# Patient Record
Sex: Male | Born: 1970 | Race: White | Hispanic: No | Marital: Single | Smoking: Never smoker
Health system: Southern US, Community
[De-identification: ages and names within clinical notes are randomized; demographics above are authoritative.]

## PROBLEM LIST (undated history)

## (undated) DIAGNOSIS — I639 Cerebral infarction, unspecified: Secondary | ICD-10-CM

## (undated) DIAGNOSIS — Z789 Other specified health status: Secondary | ICD-10-CM

## (undated) HISTORY — PX: NO PAST SURGERIES: SHX2092

---

## 2007-07-13 ENCOUNTER — Emergency Department (HOSPITAL_COMMUNITY): Admission: EM | Admit: 2007-07-13 | Discharge: 2007-07-13 | Payer: Self-pay | Admitting: Emergency Medicine

## 2010-09-13 ENCOUNTER — Encounter (INDEPENDENT_AMBULATORY_CARE_PROVIDER_SITE_OTHER): Payer: Self-pay | Admitting: *Deleted

## 2010-09-13 ENCOUNTER — Ambulatory Visit: Admit: 2010-09-13 | Payer: Self-pay | Admitting: Internal Medicine

## 2010-09-28 ENCOUNTER — Encounter: Payer: Self-pay | Admitting: Family Medicine

## 2010-09-29 NOTE — Letter (Signed)
Summary: Unable to Reach, Consult Scheduled  [facility]  [address]   [place], [place] [address]   Phone: [phone]  Fax: [phone]    09/13/2010  MACLAIN Konicki [address] [place], [place]  [address] 1971/08/08   Dear Mr. Gulla,   We have been unable to reach you by phone.    At the recommendation of DR Phillips Odor  we have been asked to schedule you a consult with DR Jena Gauss for GERD.  Please call our office at [phone].     Thank you,    Diana Eves  [facility] R. Roetta Sessions, M.D.    Jonette Eva, M.D. Lorenza Burton, FNP-BC    Tana Coast, PA-C Phone: [phone]    Fax: [phone]

## 2011-11-21 ENCOUNTER — Encounter (HOSPITAL_COMMUNITY): Payer: Self-pay | Admitting: *Deleted

## 2011-11-21 ENCOUNTER — Emergency Department (HOSPITAL_COMMUNITY)
Admission: EM | Admit: 2011-11-21 | Discharge: 2011-11-21 | Disposition: A | Discharge status: Home / Self Care | Payer: Self-pay | Attending: Emergency Medicine | Admitting: Emergency Medicine

## 2011-11-21 ENCOUNTER — Other Ambulatory Visit: Payer: Self-pay

## 2011-11-21 ENCOUNTER — Emergency Department (HOSPITAL_COMMUNITY): Payer: Self-pay

## 2011-11-21 DIAGNOSIS — R509 Fever, unspecified: Secondary | ICD-10-CM | POA: Insufficient documentation

## 2011-11-21 DIAGNOSIS — R070 Pain in throat: Secondary | ICD-10-CM | POA: Insufficient documentation

## 2011-11-21 DIAGNOSIS — R61 Generalized hyperhidrosis: Secondary | ICD-10-CM | POA: Insufficient documentation

## 2011-11-21 DIAGNOSIS — R0789 Other chest pain: Secondary | ICD-10-CM

## 2011-11-21 DIAGNOSIS — R0602 Shortness of breath: Secondary | ICD-10-CM | POA: Insufficient documentation

## 2011-11-21 DIAGNOSIS — J40 Bronchitis, not specified as acute or chronic: Secondary | ICD-10-CM | POA: Insufficient documentation

## 2011-11-21 DIAGNOSIS — L98499 Non-pressure chronic ulcer of skin of other sites with unspecified severity: Secondary | ICD-10-CM | POA: Insufficient documentation

## 2011-11-21 DIAGNOSIS — R071 Chest pain on breathing: Secondary | ICD-10-CM | POA: Insufficient documentation

## 2011-11-21 DIAGNOSIS — R112 Nausea with vomiting, unspecified: Secondary | ICD-10-CM | POA: Insufficient documentation

## 2011-11-21 DIAGNOSIS — F411 Generalized anxiety disorder: Secondary | ICD-10-CM | POA: Insufficient documentation

## 2011-11-21 DIAGNOSIS — IMO0001 Reserved for inherently not codable concepts without codable children: Secondary | ICD-10-CM | POA: Insufficient documentation

## 2011-11-21 LAB — COMPREHENSIVE METABOLIC PANEL
ALT: 46 U/L (ref 0–53)
AST: 26 U/L (ref 0–37)
Alkaline Phosphatase: 67 U/L (ref 39–117)
CO2: 19 mEq/L (ref 19–32)
Chloride: 106 mEq/L (ref 96–112)
Creatinine, Ser: 0.82 mg/dL (ref 0.50–1.35)
GFR calc non Af Amer: >90 mL/min (ref >90)
Total Bilirubin: 0.2 mg/dL — ABNORMAL LOW (ref 0.3–1.2)

## 2011-11-21 LAB — CBC
HCT: 42.2 % (ref 39.0–52.0)
Hemoglobin: 14.2 g/dL (ref 13.0–17.0)
RDW: 13.5 % (ref 11.5–15.5)
WBC: 7.9 10*3/uL (ref 4.0–10.5)

## 2011-11-21 LAB — POCT I-STAT TROPONIN I: Troponin i, poc: 0 ng/mL (ref 0.00–0.08)

## 2011-11-21 LAB — DIFFERENTIAL
Basophils Absolute: 0 10*3/uL (ref 0.0–0.1)
Lymphocytes Relative: 15 % (ref 12–46)
Monocytes Absolute: 0.7 10*3/uL (ref 0.1–1.0)
Neutro Abs: 6 10*3/uL (ref 1.7–7.7)

## 2011-11-21 MED ORDER — KETOROLAC TROMETHAMINE 30 MG/ML IJ SOLN
30.0000 mg | Freq: Once | INTRAMUSCULAR | Status: AC
  Administered 2011-11-21: 30 mg via INTRAVENOUS
  Filled 2011-11-21: qty 1

## 2011-11-21 MED ORDER — FAMOTIDINE IN NACL 20-0.9 MG/50ML-% IV SOLN
20.0000 mg | Freq: Once | INTRAVENOUS | Status: AC
  Administered 2011-11-21: 20 mg via INTRAVENOUS
  Filled 2011-11-21: qty 50

## 2011-11-21 MED ORDER — SODIUM CHLORIDE 0.9 % IV BOLUS (SEPSIS)
1000.0000 mL | Freq: Once | INTRAVENOUS | Status: AC
  Administered 2011-11-21: 1000 mL via INTRAVENOUS

## 2011-11-21 MED ORDER — IBUPROFEN 600 MG PO TABS: 600.0000 mg | ORAL_TABLET | Freq: Four times a day (QID) | ORAL | Status: AC | PRN

## 2011-11-21 MED ORDER — CYCLOBENZAPRINE HCL 5 MG PO TABS: 5.0000 mg | ORAL_TABLET | Freq: Three times a day (TID) | ORAL | Status: AC | PRN

## 2011-11-21 MED ORDER — SODIUM CHLORIDE 0.9 % IV BOLUS (SEPSIS)
2000.0000 mL | Freq: Once | INTRAVENOUS | Status: AC
  Administered 2011-11-21: 2000 mL via INTRAVENOUS

## 2011-11-21 MED ORDER — ONDANSETRON HCL 4 MG/2ML IJ SOLN
4.0000 mg | Freq: Once | INTRAMUSCULAR | Status: AC
  Administered 2011-11-21: 4 mg via INTRAVENOUS
  Filled 2011-11-21: qty 2

## 2011-11-21 MED ORDER — METOCLOPRAMIDE HCL 5 MG/ML IJ SOLN
10.0000 mg | Freq: Once | INTRAMUSCULAR | Status: AC
  Administered 2011-11-21: 10 mg via INTRAVENOUS
  Filled 2011-11-21: qty 2

## 2011-11-21 MED ORDER — GI COCKTAIL ~~LOC~~
30.0000 mL | Freq: Once | ORAL | Status: AC
  Administered 2011-11-21: 30 mL via ORAL
  Filled 2011-11-21: qty 30

## 2011-11-21 MED ORDER — SODIUM CHLORIDE 0.9 % IV SOLN
Freq: Once | INTRAVENOUS | Status: AC
  Administered 2011-11-21: 13:00:00 via INTRAVENOUS

## 2011-11-21 MED ORDER — PROMETHAZINE HCL 25 MG PO TABS: 25.0000 mg | ORAL_TABLET | Freq: Four times a day (QID) | ORAL | Status: AC | PRN

## 2011-11-21 MED ORDER — AMOXICILLIN 500 MG PO CAPS: 500.0000 mg | ORAL_CAPSULE | Freq: Three times a day (TID) | ORAL | Status: AC

## 2011-11-21 MED ORDER — DIPHENHYDRAMINE HCL 50 MG/ML IJ SOLN
50.0000 mg | Freq: Once | INTRAMUSCULAR | Status: AC
  Administered 2011-11-21: 50 mg via INTRAVENOUS
  Filled 2011-11-21: qty 1

## 2011-11-21 NOTE — ED Provider Notes (Signed)
History   This chart was scribed for Ward Givens, MD by Brooks Sailors. The patient was seen in room APA14/APA14. Patient's care was started at 1252.   CSN: [id number]  Arrival date & time 11/21/11  1252   First MD Initiated Contact with Patient 11/21/11 1258      Chief Complaint  Patient presents with  . Chest Pain    (Consider location/radiation/quality/duration/timing/severity/associated sxs/prior treatment) HPI Christian Russell is a 41 y.o. male who presents to the Emergency Department complaining of constant moderate to severe chest pain described as sharp onset this morning upon awaking starting about 8:30 am today  with associated SOB and episode of n/v. Pain is located in the middle of the chest and severely aggravated by deep breathing and coughing. Patient also notes experiencing flu like symptoms started three days ago and are persistent since consisting of fever of 101, myalgias, mild sore throat, night sweats, weakness, nausea, and vomiting. Denies diaphoresis, diarrhea and a history of similar chest pain. Patient denies smoking and does drink alcohol.   PCP none  History reviewed. No pertinent past medical history.  History reviewed. No pertinent past surgical history.  No family history on file. ? FOP had MI  History  Substance Use Topics  . Smoking status: No  . Smokeless tobacco: Not on file  . 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    Review of Systems  All other systems reviewed and are negative.   10 Systems reviewed and are negative for acute change except as noted in the HPI.  Allergies  Review of patient's allergies indicates no known allergies.  Home Medications   Current Outpatient Rx  Name Route Sig Dispense Refill  . DM-GUAIFENESIN ER 30-600 MG PO TB12 Oral Take 1 tablet by mouth every 12 (twelve) hours. Congestion/Just started taking these today    .  PSEUDOEPHEDRINE-APAP-DM 60-1000-30 MG/30ML PO LIQD Oral Take 30 mLs by mouth 3 (three) times daily as needed. Congestion/cough    . AMOXICILLIN 500 MG PO CAPS Oral Take 1 capsule (500 mg total) by mouth 3 (three) times daily. 30 capsule 0  . CYCLOBENZAPRINE HCL 5 MG PO TABS Oral Take 1 tablet (5 mg total) by mouth 3 (three) times daily as needed (for chest soreness). 30 tablet 0  . IBUPROFEN 600 MG PO TABS Oral Take 1 tablet (600 mg total) by mouth every 6 (six) hours as needed for pain. 60 tablet 0  . PROMETHAZINE HCL 25 MG PO TABS Oral Take 1 tablet (25 mg total) by mouth every 6 (six) hours as needed for nausea. 10 tablet 0    BP 132/98  Pulse 99  Ht 5\' 6"  (1.676 m)  Wt 150 lb (68.04 kg)  BMI 24.21 kg/m2  SpO2 100%  Vital signs normal    Physical Exam  Nursing note and vitals reviewed. Constitutional: He is oriented to person, place, and time. He appears well-developed and well-nourished.  Non-toxic appearance. He does not appear ill. No distress.  HENT:  Head: Normocephalic and atraumatic.  Right Ear: External ear normal.  Left Ear: External ear normal.  Nose: Nose normal. No mucosal edema or rhinorrhea.  Mouth/Throat: Oropharynx is clear and moist and mucous membranes are normal. No dental abscesses or uvula swelling.       Dry mouth and tongue  Eyes: Conjunctivae and EOM are normal. Pupils are equal, round, and reactive to light.  Neck: Normal  range of motion and full passive range of motion without pain. Neck supple. No tracheal deviation present.  Cardiovascular: Normal rate, regular rhythm and normal heart sounds.  Exam reveals no gallop and no friction rub.   No murmur heard. Pulmonary/Chest: Effort normal and breath sounds normal. No respiratory distress. He has no wheezes. He has no rhonchi. He has no rales. He exhibits no tenderness and no crepitus.       Tender lower left CCJ  Abdominal: Soft. Normal appearance and bowel sounds are normal. He exhibits no distension.  There is no tenderness. There is no rebound and no guarding.       Active bowel sounds   Musculoskeletal: Normal range of motion. He exhibits no edema and no tenderness.       Moves all extremities well.   Neurological: He is alert and oriented to person, place, and time. He has normal strength. No cranial nerve deficit or sensory deficit.  Skin: Skin is warm, dry and intact. No rash noted. No erythema. No pallor.  Psychiatric: His speech is normal and behavior is normal. His mood appears not anxious.       anxious    ED Course  Procedures (including critical care time)   Medications  0.9 %  sodium chloride infusion (  Intravenous New Bag/Given 11/21/11 1317)  sodium chloride 0.9 % bolus 2,000 mL (2000 mL Intravenous Given 11/21/11 1316)  metoCLOPramide (REGLAN) injection 10 mg (10 mg Intravenous Given 11/21/11 1317)  diphenhydrAMINE (BENADRYL) injection 50 mg (50 mg Intravenous Given 11/21/11 1317)  ondansetron (ZOFRAN) injection 4 mg (4 mg Intravenous Given 11/21/11 1317)  famotidine (PEPCID) IVPB 20 mg (0 mg Intravenous Stopped 11/21/11 1528)  ketorolac (TORADOL) 30 MG/ML injection 30 mg (30 mg Intravenous Given 11/21/11 1534)  sodium chloride 0.9 % bolus 1,000 mL (1000 mL Intravenous Given 11/21/11 1534)  gi cocktail (Maalox,Lidocaine,Donnatal) (30 mL Oral Given 11/21/11 1536)     DIAGNOSTIC STUDIES: Oxygen Saturation is 100% on room air, normal by my interpretation.    COORDINATION OF CARE: 1:05PM- Patient informed of current plan for treatment and evaluation and agrees with plan at this time.   Recheck 15:30 states his pain was better but is now coming back.   Recheck 16:30 feeling better after toradol. Eating crackers feels ready to go home. States he had some shoes in his bedroom that had racing car fuel on them and he thinks maybe the fumes made him ill. He has put the shoes outside.   Results for orders placed during the hospital encounter of 11/21/11  CBC      Component Value  Range   WBC 7.9  4.0 - 10.5 (K/uL)   RBC 3.91 (*) 4.22 - 5.81 (MIL/uL)   Hemoglobin 14.2  13.0 - 17.0 (g/dL)   HCT 16.1  09.6 - 04.5 (%)   MCV 107.9 (*) 78.0 - 100.0 (fL)   MCH 36.3 (*) 26.0 - 34.0 (pg)   MCHC 33.6  30.0 - 36.0 (g/dL)   RDW 40.9  81.1 - 91.4 (%)   Platelets 244  150 - 400 (K/uL)  DIFFERENTIAL      Component Value Range   Neutrophils Relative 76  43 - 77 (%)   Neutro Abs 6.0  1.7 - 7.7 (K/uL)   Lymphocytes Relative 15  12 - 46 (%)   Lymphs Abs 1.2  0.7 - 4.0 (K/uL)   Monocytes Relative 8  3 - 12 (%)   Monocytes Absolute 0.7  0.1 - 1.0 (  K/uL)   Eosinophils Relative 0  0 - 5 (%)   Eosinophils Absolute 0.0  0.0 - 0.7 (K/uL)   Basophils Relative 0  0 - 1 (%)   Basophils Absolute 0.0  0.0 - 0.1 (K/uL)  COMPREHENSIVE METABOLIC PANEL      Component Value Range   Sodium 136  135 - 145 (mEq/L)   Potassium 3.5  3.5 - 5.1 (mEq/L)   Chloride 106  96 - 112 (mEq/L)   CO2 19  19 - 32 (mEq/L)   Glucose, Bld 113 (*) 70 - 99 (mg/dL)   BUN 7  6 - 23 (mg/dL)   Creatinine, Ser 2.13  0.50 - 1.35 (mg/dL)   Calcium 9.3  8.4 - 08.6 (mg/dL)   Total Protein 7.5  6.0 - 8.3 (g/dL)   Albumin 4.3  3.5 - 5.2 (g/dL)   AST 26  0 - 37 (U/L)   ALT 46  0 - 53 (U/L)   Alkaline Phosphatase 67  39 - 117 (U/L)   Total Bilirubin 0.2 (*) 0.3 - 1.2 (mg/dL)   GFR calc non Af Amer >90  >90 (mL/min)   GFR calc Af Amer >90  >90 (mL/min)  D-DIMER, QUANTITATIVE      Component Value Range   D-Dimer, Quant <0.22  0.00 - 0.48 (ug/mL-FEU)  POCT I-STAT TROPONIN I      Component Value Range   Troponin i, poc 0.00  0.00 - 0.08 (ng/mL)   Comment 3           POCT I-STAT TROPONIN I      Component Value Range   Troponin i, poc 0.00  0.00 - 0.08 (ng/mL)   Comment 3            Laboratory interpretation all normal  Dg Chest Port 1 View  11/21/2011  *RADIOLOGY REPORT*  Clinical Data: Chest pain, shortness of breath.  PORTABLE CHEST - 1 VIEW  Comparison: None.  Findings: Heart and mediastinal contours are  within normal limits. No focal opacities or effusions.  No acute bony abnormality.  IMPRESSION: No active cardiopulmonary disease.  Original Report Authenticated By: Cyndie Chime, M.D.    Date: 11/21/2011  Rate: 105  Rhythm: sinus tachycardia  QRS Axis: normal  Intervals: normal  ST/T Wave abnormalities: nonspecific ST/T changes  Conduction Disutrbances:none  Narrative Interpretation:   Old EKG Reviewed: none available      1. Bronchitis   2. Chest wall pain   3. Nausea and vomiting    New Prescriptions   AMOXICILLIN (AMOXIL) 500 MG CAPSULE    Take 1 capsule (500 mg total) by mouth 3 (three) times daily.   CYCLOBENZAPRINE (FLEXERIL) 5 MG TABLET    Take 1 tablet (5 mg total) by mouth 3 (three) times daily as needed (for chest soreness).   IBUPROFEN (ADVIL,MOTRIN) 600 MG TABLET    Take 1 tablet (600 mg total) by mouth every 6 (six) hours as needed for pain.   PROMETHAZINE (PHENERGAN) 25 MG TABLET    Take 1 tablet (25 mg total) by mouth every 6 (six) hours as needed for nausea.   Plan discharge Devoria Albe, MD, FACEP    MDM   I personally performed the services described in this documentation, which was scribed in my presence. The recorded information has been reviewed and considered. Devoria Albe, MD, FACEPWard Givens, MD 11/21/11 [phone]

## 2011-11-21 NOTE — ED Notes (Signed)
Pt presents with chest pain x 1 day, which was relieved last night with OTC cold/flu medication, however the pain return this morning. Denies arm pain, N/V

## 2011-11-21 NOTE — Discharge Instructions (Signed)
Drink plenty of fluids (clear liquids) the next 12-24 hours then start the BRAT diet. . Use the phenergan for nausea or vomiting. Take the amoxil until gone for your bronchitis. Take the ibuprofen and flexeril for your chest wall pain.  Recheck if you get worse.  Clear Liquid Diet The clear liquid dietconsists of foods that are liquid or will become liquid at room temperature.You should be able to see through the liquid and beverages. Examples of foods allowed on a clear liquid diet include fruit juice, broth or bouillon, gelatin, or frozen ice pops. The purpose of this diet is to provide necessary fluid, electrolytes such as sodium and potassium, and energy to keep the body functioning during times when you are not able to consume a regular diet.A clear liquid diet should not be continued for long periods of time as it is not nutritionally adequate.  REASONS FOR USING A CLEAR LIQUID DIET  In sudden onset (acute) conditions for a patient before or after surgery.   As the first step in oral feeding.   For fluid and electrolyte replacement in diarrheal diseases.   As a diet before certain medical tests are performed.  ADEQUACY The clear liquid diet is adequate only in ascorbic acid, according to the Recommended Dietary Allowances of the [employer]. CHOOSING FOODS Breads and Starches  Allowed:  None are allowed.   Avoid: All are avoided.  Vegetables  Allowed:  Strained tomato or vegetable juice.   Avoid: Any others.  Fruit  Allowed:  Strained fruit juices and fruit drinks. Include 1 serving of citrus or vitamin C-enriched fruit juice daily.   Avoid: Any others.  Meat and Meat Substitutes  Allowed:  None are allowed.   Avoid: All are avoided.  Milk  Allowed:  None are allowed.   Avoid: All are avoided.  Soups and Combination Foods  Allowed:  Clear bouillon, broth, or strained broth-based soups.   Avoid: Any others.  Desserts and Sweets  Allowed:  Sugar,  honey. High protein gelatin. Flavored gelatin, ices, or frozen ice pops that do not contain milk.   Avoid: Any others.  Fats and Oils  Allowed:  None are allowed.   Avoid: All are avoided.  Beverages  Allowed: Cereal beverages, coffee (regular or decaffeinated), tea, or soda at the discretion of your caregiver.   Avoid: Any others.  Condiments  Allowed:  Iodized salt.   Avoid: Any others, including pepper.  Supplements  Allowed:  Liquid nutrition beverages.   Avoid: Any others that contain lactose or fiber.  SAMPLE MEAL PLAN Breakfast  4 oz (120 mL) strained orange juice.    to 1 cup (125 to 250 mL) gelatin (plain or fortified).   1 cup (250 mL) beverage (coffee or tea).   Sugar, if desired.  Midmorning Snack   cup (125 mL) gelatin (plain or fortified).  Lunch  1 cup (250 mL) broth or consomm.   4 oz (120 mL) strained grapefruit juice.    cup (125 mL) gelatin (plain or fortified).   1 cup (250 mL) beverage (coffee or tea).   Sugar, if desired.  Midafternoon Snack   cup (125 mL) fruit ice.    cup (125 mL) strained fruit juice.  Dinner  1 cup (250 mL) broth or consomm.    cup (125 mL) cranberry juice.    cup (125 mL) flavored gelatin (plain or fortified).   1 cup (250 mL) beverage (coffee or tea).   Sugar, if desired.  Evening Snack  4 oz (120 mL) strained apple juice (vitamin C-fortified).    cup (125 mL) flavored gelatin (plain or fortified).  Document Released: 08/14/2005 Document Revised: 08/03/2011 Document Reviewed: 11/11/2010 [facility] Patient Information 2012 [place], [place].B.R.A.T. Diet Your doctor has recommended the B.R.A.T. diet for you or your child until the condition improves. This is often used to help control diarrhea and vomiting symptoms. If you or your child can tolerate clear liquids, you may have:  Bananas.   Rice.   Applesauce.   Toast (and other simple starches such as crackers, potatoes, noodles).  Be sure  to avoid dairy products, meats, and fatty foods until symptoms are better. Fruit juices such as apple, grape, and prune juice can make diarrhea worse. Avoid these. Continue this diet for 2 days or as instructed by your caregiver. Document Released: 08/14/2005 Document Revised: 08/03/2011 Document Reviewed: 01/31/2007 [facility] Patient Information 2012 [place], [place].

## 2014-10-16 ENCOUNTER — Encounter: Payer: Self-pay | Admitting: *Deleted

## 2014-11-11 ENCOUNTER — Encounter: Payer: Self-pay | Admitting: Family Medicine

## 2014-11-11 ENCOUNTER — Ambulatory Visit (INDEPENDENT_AMBULATORY_CARE_PROVIDER_SITE_OTHER): Payer: BLUE CROSS/BLUE SHIELD | Admitting: Family Medicine

## 2014-11-11 VITALS — BP 110/72 | HR 78 | Temp 98.7°F | Resp 12 | Ht 64.0 in | Wt 157.0 lb

## 2014-11-11 DIAGNOSIS — Z23 Encounter for immunization: Secondary | ICD-10-CM

## 2014-11-11 DIAGNOSIS — Z0000 Encounter for general adult medical examination without abnormal findings: Secondary | ICD-10-CM

## 2014-11-11 LAB — COMPREHENSIVE METABOLIC PANEL
ALT: 23 U/L (ref 0–53)
AST: 19 U/L (ref 0–37)
Albumin: 4.6 g/dL (ref 3.5–5.2)
Alkaline Phosphatase: 59 U/L (ref 39–117)
BUN: 14 mg/dL (ref 6–23)
CALCIUM: 9.4 mg/dL (ref 8.4–10.5)
CHLORIDE: 104 meq/L (ref 96–112)
CO2: 27 meq/L (ref 19–32)
Creat: 0.9 mg/dL (ref 0.50–1.35)
Glucose, Bld: 109 mg/dL — ABNORMAL HIGH (ref 70–99)
Potassium: 4.7 mEq/L (ref 3.5–5.3)
Sodium: 142 mEq/L (ref 135–145)
TOTAL PROTEIN: 7 g/dL (ref 6.0–8.3)
Total Bilirubin: 0.4 mg/dL (ref 0.2–1.2)

## 2014-11-11 LAB — CBC WITH DIFFERENTIAL/PLATELET
Basophils Absolute: 0.1 10*3/uL (ref 0.0–0.1)
Basophils Relative: 1 % (ref 0–1)
EOS ABS: 0.1 10*3/uL (ref 0.0–0.7)
Eosinophils Relative: 1 % (ref 0–5)
HCT: 42.6 % (ref 39.0–52.0)
Hemoglobin: 14.7 g/dL (ref 13.0–17.0)
Lymphocytes Relative: 28 % (ref 12–46)
Lymphs Abs: 2.5 10*3/uL (ref 0.7–4.0)
MCH: 33.6 pg (ref 26.0–34.0)
MCHC: 34.5 g/dL (ref 30.0–36.0)
MCV: 97.5 fL (ref 78.0–100.0)
MPV: 9.3 fL (ref 8.6–12.4)
Monocytes Absolute: 0.6 10*3/uL (ref 0.1–1.0)
Monocytes Relative: 7 % (ref 3–12)
NEUTROS ABS: 5.6 10*3/uL (ref 1.7–7.7)
NEUTROS PCT: 63 % (ref 43–77)
PLATELETS: 332 10*3/uL (ref 150–400)
RBC: 4.37 MIL/uL (ref 4.22–5.81)
RDW: 13.3 % (ref 11.5–15.5)
WBC: 8.9 10*3/uL (ref 4.0–10.5)

## 2014-11-11 LAB — LIPID PANEL
CHOL/HDL RATIO: 4.3 ratio
CHOLESTEROL: 182 mg/dL (ref 0–200)
HDL: 42 mg/dL (ref >=40)
LDL Cholesterol: 90 mg/dL (ref 0–99)
Triglycerides: 249 mg/dL — ABNORMAL HIGH (ref <150)
VLDL: 50 mg/dL — AB (ref 0–40)

## 2014-11-11 NOTE — Patient Instructions (Signed)
I recommend eye visit once a year I recommend dental visit every 6 months Goal is to  Exercise 30 minutes 5 days a week We will send a letter with lab results  Tetanus Booster given F/U as needed

## 2014-11-11 NOTE — Addendum Note (Signed)
Addended by: Legrand RamsWILLIS, Siyona Coto B on: 11/11/2014 10:40 AM   Modules accepted: Orders

## 2014-11-11 NOTE — Progress Notes (Signed)
Patient ID: Christian Russell [place]ripps, male   DOB: November 17, 1970, 44 y.o.   MRN: [id number]   Subjective:    Patient ID: Christian Russell [place], male    DOB: November 17, 1970, 44 y.o.   MRN: [id number]  Patient presents for New Patient and Annual Exam  Pt here for CPE, no specific concerns. No previous PCP, recently got insurance.   Had some mild bleeding from belly button a few weeks ago, occurred 1 day, now resolved.  Family history reviewed- notable Father- DM, HTN, heart disease   Non smoker Works 3rd shift for [employer]    Review Of Systems:  GEN- denies fatigue, fever, weight loss,weakness, recent illness HEENT- denies eye drainage, change in vision, nasal discharge, CVS- denies chest pain, palpitations RESP- denies SOB, cough, wheeze ABD- denies N/V, change in stools, abd pain GU- denies dysuria, hematuria, dribbling, incontinence MSK- denies joint pain, muscle aches, injury Neuro- denies headache, dizziness, syncope, seizure activity       Objective:    BP 110/72 mmHg  Pulse 78  Temp(Src) 98.7 F (37.1 C) (Oral)  Resp 12  Ht 5\' 6"  (1.676 m)  Wt 157 lb (71.215 kg)  BMI 25.35 kg/m2 GEN- NAD, alert and oriented x3 HEENT- PERRL, EOMI, non injected sclera, pink conjunctiva, MMM, oropharynx clear, poor dentition Neck- Supple, no thyromegaly CVS- RRR, no murmur RESP-CTAB ABD-NABS,soft,NT,ND, umbilicus c/d/i Psych- normal affect and mood , quiet EXT- No edema Pulses- Radial, DP- 2+        Assessment & Plan:      Problem List Items Addressed This Visit    None    Visit Diagnoses    Routine general medical examination at a health care facility    -  Primary    CPE done, TDAP, fasting labs, dental information given    Relevant Orders    CBC with Differential/Platelet    Comprehensive metabolic panel    Lipid panel       Note: This dictation was prepared with Dragon dictation along with smaller phrase technology. Any transcriptional errors that result from this process are unintentional.

## 2015-10-28 ENCOUNTER — Encounter: Payer: Self-pay | Admitting: Family Medicine

## 2015-10-28 ENCOUNTER — Ambulatory Visit (INDEPENDENT_AMBULATORY_CARE_PROVIDER_SITE_OTHER): Payer: BLUE CROSS/BLUE SHIELD | Admitting: Physician Assistant

## 2015-10-28 ENCOUNTER — Encounter: Payer: Self-pay | Admitting: Physician Assistant

## 2015-10-28 VITALS — BP 152/104 | HR 100 | Temp 98.7°F | Resp 18 | Wt 160.0 lb

## 2015-10-28 DIAGNOSIS — J101 Influenza due to other identified influenza virus with other respiratory manifestations: Secondary | ICD-10-CM

## 2015-10-28 DIAGNOSIS — R52 Pain, unspecified: Secondary | ICD-10-CM | POA: Diagnosis not present

## 2015-10-28 LAB — INFLUENZA A AND B AG, IMMUNOASSAY
Influenza A Antigen: DETECTED — AB
Influenza B Antigen: NOT DETECTED

## 2015-10-28 MED ORDER — OSELTAMIVIR PHOSPHATE 75 MG PO CAPS: 75.0000 mg | ORAL_CAPSULE | Freq: Two times a day (BID) | ORAL | Status: DC

## 2015-10-28 MED ORDER — PREDNISONE 20 MG PO TABS: ORAL_TABLET | ORAL | Status: DC

## 2015-10-28 NOTE — Progress Notes (Signed)
Patient ID: Christian Russell MRN: [id number], DOB: 1971-04-30, 45 y.o. Date of Encounter: 10/28/2015, 10:28 AM    Chief Complaint:  Chief Complaint  Patient presents with  . sick 2-3 days    sinuses, facial pain, body aches, bloody nasal secretions     HPI: 45 y.o. year old white male presents with above.   He points to his sinus areas and says that these areas have been throbbing and with pressure. Says he is having body aches that his arms and legs and entire body feel achy. Has had no known fever. Says that he is blowing some thick mucus out of his nose. Says he has had no cough and no sore throat. Says that all of the symptoms just started in the last 2 days.  works third shift Designer, fashion/clothing. Scheduled to work tonight but then is scheduled to be off until he goes in Sunday night.     Home Meds:   Outpatient Prescriptions Prior to Visit  Medication Sig Dispense Refill  . cyanocobalamin 100 MCG tablet Take 100 mcg by mouth daily. Reported on 10/28/2015     No facility-administered medications prior to visit.    Allergies: No Known Allergies    Review of Systems: See HPI for pertinent ROS. All other ROS negative.    Physical Exam: Blood pressure 152/104, pulse 100, temperature 98.7 F (37.1 C), temperature source Oral, resp. rate 18, weight 160 lb (72.576 kg)., Body mass index is 27.45 kg/(m^2). General:  WM. Appears in no acute distress. HEENT: Normocephalic, atraumatic, eyes without discharge, sclera non-icteric, nares are without discharge. Bilateral auditory canals clear, TM's are without perforation, pearly grey and translucent with reflective cone of light bilaterally. Oral cavity moist, posterior pharynx without exudate, erythema, peritonsillar abscess. positive tenderness with percussion to left maxillary sinus. Minimal tenderness with percussion to right maxillary and frontal sinuses.  Neck: Supple. No thyromegaly. No lymphadenopathy. Lungs: Clear bilaterally to  auscultation without wheezes, rales, or rhonchi. Breathing is unlabored. Heart: Regular rhythm. No murmurs, rubs, or gallops. Msk:  Strength and tone normal for age. Extremities/Skin: Warm and dry. Neuro: Alert and oriented X 3. Moves all extremities spontaneously. Gait is normal. CNII-XII grossly in tact. Psych:  Responds to questions appropriately with a normal affect.   Results for orders placed or performed in visit on 10/28/15  Influenza A and B Ag, Immunoassay  Result Value Ref Range   Source: NASAL    Influenza A Antigen Detected (A) Not Detected   Influenza B Antigen Not Detected Not Detected     ASSESSMENT AND PLAN:  45 y.o. year old male with  1. Influenza A He is to start Tamiflu immediately and take as directed. He can use the prednisone taper to help with the sinus pressure. Note given for out of work. May return Sunday. He is to follow-up if develops increased symptoms or if symptoms do not resolve in the next one to 2 weeks. - oseltamivir (TAMIFLU) 75 MG capsule; Take 1 capsule (75 mg total) by mouth 2 (two) times daily.  Dispense: 10 capsule; Refill: 0 - predniSONE (DELTASONE) 20 MG tablet; Take 3 daily for 2 days, then 2 daily for 2 days, then 1 daily for 2 days.  Dispense: 12 tablet; Refill: 0  2. Body aches - Influenza A and B Ag, Immunoassay  His blood pressure high at visit today. reviewed that blood pressure was excellent at his physical here 11/11/2014. At that time it was 110/72. Suspect blood pressure high  today secondary to illness and using over-the-counter decongestants.   Murray Hodgkins [place], [place], [facility] 10/28/2015 10:28 AM

## 2015-11-24 ENCOUNTER — Ambulatory Visit: Payer: BLUE CROSS/BLUE SHIELD | Admitting: Family Medicine

## 2015-12-03 ENCOUNTER — Encounter: Payer: Self-pay | Admitting: Family Medicine

## 2016-04-07 ENCOUNTER — Ambulatory Visit: Payer: BLUE CROSS/BLUE SHIELD | Admitting: Family Medicine

## 2016-05-26 ENCOUNTER — Encounter: Payer: Self-pay | Admitting: Family Medicine

## 2016-05-26 ENCOUNTER — Ambulatory Visit (INDEPENDENT_AMBULATORY_CARE_PROVIDER_SITE_OTHER): Payer: BLUE CROSS/BLUE SHIELD | Admitting: Family Medicine

## 2016-05-26 VITALS — BP 140/88 | HR 78 | Temp 98.2°F | Resp 14 | Ht 64.0 in | Wt 157.0 lb

## 2016-05-26 DIAGNOSIS — M545 Low back pain, unspecified: Secondary | ICD-10-CM

## 2016-05-26 DIAGNOSIS — F39 Unspecified mood [affective] disorder: Secondary | ICD-10-CM

## 2016-05-26 DIAGNOSIS — F411 Generalized anxiety disorder: Secondary | ICD-10-CM | POA: Diagnosis not present

## 2016-05-26 DIAGNOSIS — E781 Pure hyperglyceridemia: Secondary | ICD-10-CM | POA: Diagnosis not present

## 2016-05-26 DIAGNOSIS — N529 Male erectile dysfunction, unspecified: Secondary | ICD-10-CM | POA: Diagnosis not present

## 2016-05-26 LAB — LIPID PANEL
CHOLESTEROL: 202 mg/dL — AB (ref 125–200)
HDL: 42 mg/dL (ref >=40)
LDL Cholesterol: 111 mg/dL (ref <130)
TRIGLYCERIDES: 247 mg/dL — AB (ref <150)
Total CHOL/HDL Ratio: 4.8 Ratio (ref <=5.0)
VLDL: 49 mg/dL — AB (ref <30)

## 2016-05-26 LAB — CBC WITH DIFFERENTIAL/PLATELET
BASOS ABS: 0 {cells}/uL (ref 0–200)
Basophils Relative: 0 %
EOS ABS: 148 {cells}/uL (ref 15–500)
Eosinophils Relative: 2 %
HCT: 39.4 % (ref 38.5–50.0)
HEMOGLOBIN: 13.5 g/dL (ref 13.0–17.0)
LYMPHS ABS: 1924 {cells}/uL (ref 850–3900)
Lymphocytes Relative: 26 %
MCH: 35.5 pg — AB (ref 27.0–33.0)
MCHC: 34.3 g/dL (ref 32.0–36.0)
MCV: 103.7 fL — AB (ref 80.0–100.0)
MONO ABS: 592 {cells}/uL (ref 200–950)
MPV: 8.9 fL (ref 7.5–12.5)
Monocytes Relative: 8 %
NEUTROS ABS: 4736 {cells}/uL (ref 1500–7800)
Neutrophils Relative %: 64 %
Platelets: 300 10*3/uL (ref 140–400)
RBC: 3.8 MIL/uL — ABNORMAL LOW (ref 4.20–5.80)
RDW: 13 % (ref 11.0–15.0)
WBC: 7.4 10*3/uL (ref 3.8–10.8)

## 2016-05-26 LAB — COMPREHENSIVE METABOLIC PANEL
ALBUMIN: 4.6 g/dL (ref 3.6–5.1)
ALT: 23 U/L (ref 9–46)
AST: 18 U/L (ref 10–40)
Alkaline Phosphatase: 59 U/L (ref 40–115)
BUN: 14 mg/dL (ref 7–25)
CHLORIDE: 104 mmol/L (ref 98–110)
CO2: 28 mmol/L (ref 20–31)
CREATININE: 0.74 mg/dL (ref 0.60–1.35)
Calcium: 9.2 mg/dL (ref 8.6–10.3)
Glucose, Bld: 124 mg/dL — ABNORMAL HIGH (ref 70–99)
POTASSIUM: 3.8 mmol/L (ref 3.5–5.3)
SODIUM: 141 mmol/L (ref 135–146)
TOTAL PROTEIN: 7 g/dL (ref 6.1–8.1)
Total Bilirubin: 0.5 mg/dL (ref 0.2–1.2)

## 2016-05-26 LAB — TESTOSTERONE: Testosterone: 84 ng/dL — ABNORMAL LOW (ref 250–827)

## 2016-05-26 LAB — TSH: TSH: 0.44 mIU/L (ref 0.40–4.50)

## 2016-05-26 NOTE — Patient Instructions (Addendum)
Check hormone labs and stomach labs  [facility]  Please give work note to cover last night, can return today. F/U pending results

## 2016-05-26 NOTE — Progress Notes (Signed)
   Subjective:    Patient ID: Christian Russell, male    DOB: September 13, 1970, 45 y.o.   MRN: [id number]  Patient presents for Erectile Dysfunction (x1 year- states that he is having difficulty with getting and keepingerections) Pt here with ED for greater than 1 year, denies premature ejaculation, unable to get or sometimes sustain his erection. He has never tried and medications. Further discussion he has been very irritable and stressed. Little things  get him frustrated and then he will get an anxiety feels that may cause him to vomit. During the visit we were discussing anxiety episodes and he suddenly go up and went to restroom and had emesis. Denies abdominal pain, NB/NB no change in bowels, no association with food, only when he gets the nervous feeling. He was here today with his long term girlfriend  Low back pain on and off for a few years, mostly on right side, no sciatica symptoms, no tingling numbness in toes. Has never had evaluated. He uses heat/ice, OTC NSAIDS as needed   Due for repeat fasting labs, high TG    Review Of Systems:  GEN- denies fatigue, fever, weight loss,weakness, recent illness HEENT- denies eye drainage, change in vision, nasal discharge, CVS- denies chest pain, palpitations RESP- denies SOB, cough, wheeze ABD- +N/V, denies change in stools, abd pain GU- denies dysuria, hematuria, dribbling, incontinence MSK- + joint pain, muscle aches, injury Neuro- denies headache, dizziness, syncope, seizure activity       Objective:    BP 140/88 (BP Location: Right Arm, Patient Position: Sitting, Cuff Size: Normal)   Pulse 78   Temp 98.2 F (36.8 C) (Oral)   Resp 14   Ht 5\' 4"  (1.626 m)   Wt 157 lb (71.2 kg)   BMI 26.95 kg/m  GEN- NAD, alert and oriented x3 HEENT- PERRL, EOMI, non injected sclera, pink conjunctiva, MMM, oropharynx clear Neck- Supple, no thyromegaly CVS- RRR, no murmur RESP-CTAB ABD-NABS,soft,NT,ND Psych- a little anxious appearing, not  depressed, normal speech, no SI  MSK- mild TTP right paraspinals, spine NT, good ROM, neg SLR, strength equal bilat LE  Neuro- sensation grossly in tact bilat, normal tone LE  EXT- No edema Pulses- Radial  2+        Assessment & Plan:      Problem List Items Addressed This Visit    None    Visit Diagnoses   None.     Note: This dictation was prepared with Dragon dictation along with smaller phrase technology. Any transcriptional errors that result from this process are unintentional.

## 2016-05-28 ENCOUNTER — Encounter: Payer: Self-pay | Admitting: Family Medicine

## 2016-05-29 ENCOUNTER — Other Ambulatory Visit: Payer: Self-pay | Admitting: *Deleted

## 2016-05-29 DIAGNOSIS — R7989 Other specified abnormal findings of blood chemistry: Secondary | ICD-10-CM

## 2016-06-02 ENCOUNTER — Other Ambulatory Visit: Payer: BLUE CROSS/BLUE SHIELD

## 2016-06-02 DIAGNOSIS — R7989 Other specified abnormal findings of blood chemistry: Secondary | ICD-10-CM

## 2016-06-02 LAB — HEMOGLOBIN A1C
Hgb A1c MFr Bld: 5.2 % (ref <5.7)
Mean Plasma Glucose: 103 mg/dL

## 2016-06-02 LAB — PSA: PSA: 0.2 ng/mL (ref <=4.0)

## 2016-06-03 LAB — PROLACTIN: Prolactin: 21.9 ng/mL — ABNORMAL HIGH (ref 2.0–18.0)

## 2016-06-03 LAB — FSH/LH
FSH: 8 m[IU]/mL (ref 1.6–8.0)
LH: 1.1 m[IU]/mL — ABNORMAL LOW (ref 1.5–9.3)

## 2016-06-06 ENCOUNTER — Other Ambulatory Visit: Payer: Self-pay | Admitting: Family Medicine

## 2016-06-06 DIAGNOSIS — R7989 Other specified abnormal findings of blood chemistry: Secondary | ICD-10-CM

## 2016-06-06 DIAGNOSIS — E229 Hyperfunction of pituitary gland, unspecified: Principal | ICD-10-CM

## 2016-06-06 DIAGNOSIS — E291 Testicular hypofunction: Secondary | ICD-10-CM

## 2016-06-06 NOTE — Progress Notes (Signed)
MRI brain needed to evalute

## 2016-06-09 ENCOUNTER — Ambulatory Visit: Payer: BLUE CROSS/BLUE SHIELD | Admitting: Family Medicine

## 2016-06-12 ENCOUNTER — Ambulatory Visit (HOSPITAL_COMMUNITY)
Admission: RE | Admit: 2016-06-12 | Discharge: 2016-06-12 | Disposition: A | Discharge status: Home / Self Care | Payer: BLUE CROSS/BLUE SHIELD | Source: Ambulatory Visit | Attending: Family Medicine | Admitting: Family Medicine

## 2016-06-12 DIAGNOSIS — E229 Hyperfunction of pituitary gland, unspecified: Secondary | ICD-10-CM | POA: Diagnosis not present

## 2016-06-12 DIAGNOSIS — R7989 Other specified abnormal findings of blood chemistry: Secondary | ICD-10-CM

## 2016-06-12 DIAGNOSIS — E291 Testicular hypofunction: Secondary | ICD-10-CM | POA: Insufficient documentation

## 2016-06-12 DIAGNOSIS — R9082 White matter disease, unspecified: Secondary | ICD-10-CM | POA: Insufficient documentation

## 2016-06-12 MED ORDER — GADOBENATE DIMEGLUMINE 529 MG/ML IV SOLN
7.0000 mL | Freq: Once | INTRAVENOUS | Status: AC | PRN
  Administered 2016-06-12: 7 mL via INTRAVENOUS

## 2016-06-13 ENCOUNTER — Ambulatory Visit (INDEPENDENT_AMBULATORY_CARE_PROVIDER_SITE_OTHER): Payer: BLUE CROSS/BLUE SHIELD | Admitting: Family Medicine

## 2016-06-13 VITALS — BP 136/84 | HR 86 | Temp 97.9°F | Resp 16 | Ht 64.0 in | Wt 160.0 lb

## 2016-06-13 DIAGNOSIS — E782 Mixed hyperlipidemia: Secondary | ICD-10-CM | POA: Diagnosis not present

## 2016-06-13 DIAGNOSIS — E349 Endocrine disorder, unspecified: Secondary | ICD-10-CM

## 2016-06-13 DIAGNOSIS — E785 Hyperlipidemia, unspecified: Secondary | ICD-10-CM | POA: Insufficient documentation

## 2016-06-13 DIAGNOSIS — E291 Testicular hypofunction: Secondary | ICD-10-CM

## 2016-06-13 DIAGNOSIS — L239 Allergic contact dermatitis, unspecified cause: Secondary | ICD-10-CM | POA: Diagnosis not present

## 2016-06-13 DIAGNOSIS — R7989 Other specified abnormal findings of blood chemistry: Secondary | ICD-10-CM | POA: Insufficient documentation

## 2016-06-13 MED ORDER — PREDNISONE 20 MG PO TABS: 20.0000 mg | ORAL_TABLET | Freq: Every day | ORAL | 0 refills | Status: DC

## 2016-06-13 NOTE — Assessment & Plan Note (Signed)
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 He did not have a problems with puberty or gross that he can recall. His parents are both of short stature. We will refer her to endocrinology for further evaluation and treatment. I think he will likely need injectable testosterone. With regard to the me changes we discussed starting antidepressant versus seen if he improves with treatment of his testosterone deficiency. He will would like the latter and will hold off at this time.

## 2016-06-13 NOTE — Patient Instructions (Signed)
Referral to endocrinology  Take steroid as prescribed  F/U pending results
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
## 2016-06-13 NOTE — Assessment & Plan Note (Signed)
Lipid, shows mild elevation in his LDL from his triglycerides also elevated. Work on dietary changes. Reviewed all of his fasting labs he did have elevated blood sugar have her A1c was normal

## 2016-06-19 ENCOUNTER — Telehealth: Payer: Self-pay | Admitting: Family Medicine

## 2016-06-19 MED ORDER — ESCITALOPRAM OXALATE 5 MG PO TABS: 5.0000 mg | ORAL_TABLET | Freq: Every day | ORAL | 1 refills | Status: DC

## 2016-06-19 NOTE — Telephone Encounter (Signed)
[place] called in states Dr. Jeanice Limurham told patient if he needed something called in for his nerves she would. [facility] uses [facility].  CB# [phone]

## 2016-06-19 NOTE — Telephone Encounter (Signed)
MD please advise

## 2016-06-19 NOTE — Telephone Encounter (Signed)
Prescription sent to pharmacy.   Call placed to patient. No answer. No VM.  

## 2016-06-19 NOTE — Telephone Encounter (Signed)
Start lexapro 5mg at bedtime  

## 2016-06-20 NOTE — Telephone Encounter (Signed)
Patient returned call and made aware.   States that he did pick up prescription on 06/19/2016, and began medication last night. Reports that wile he was working, he noted that he was increasingly nauseous.   Advised to take medicaiton before bed in AM since he works 3rd shift. Will call on Thursday to F/U.

## 2016-06-22 NOTE — Telephone Encounter (Signed)
Call placed to patient. No answer. No VM.  

## 2016-06-26 NOTE — Telephone Encounter (Signed)
Call placed to patient to F/U on medication.   Spoke with patient SO, [place]. SO states that she is not sure if patient is taking medication.   Advised to have patient return call to discuss.

## 2016-07-19 ENCOUNTER — Ambulatory Visit: Payer: BLUE CROSS/BLUE SHIELD | Admitting: "Endocrinology

## 2016-11-30 ENCOUNTER — Encounter: Payer: Self-pay | Admitting: Family Medicine

## 2016-12-27 ENCOUNTER — Encounter: Payer: Self-pay | Admitting: Family Medicine

## 2017-03-14 ENCOUNTER — Encounter: Payer: Self-pay | Admitting: Family Medicine

## 2017-06-06 ENCOUNTER — Ambulatory Visit: Payer: BLUE CROSS/BLUE SHIELD | Admitting: Orthopaedic Surgery

## 2018-07-02 ENCOUNTER — Encounter: Payer: Self-pay | Admitting: Gastroenterology

## 2018-07-31 ENCOUNTER — Ambulatory Visit: Payer: BLUE CROSS/BLUE SHIELD | Admitting: Gastroenterology

## 2019-11-04 ENCOUNTER — Other Ambulatory Visit: Payer: Self-pay

## 2019-11-04 ENCOUNTER — Emergency Department (HOSPITAL_COMMUNITY)
Admission: EM | Admit: 2019-11-04 | Discharge: 2019-11-04 | Disposition: A | Discharge status: Home / Self Care | Payer: Self-pay

## 2020-05-06 ENCOUNTER — Encounter (HOSPITAL_COMMUNITY): Payer: Self-pay | Admitting: Emergency Medicine

## 2020-05-06 ENCOUNTER — Other Ambulatory Visit: Payer: Self-pay

## 2020-05-06 ENCOUNTER — Emergency Department (HOSPITAL_COMMUNITY)
Admission: EM | Admit: 2020-05-06 | Discharge: 2020-05-06 | Disposition: A | Discharge status: Home / Self Care | Payer: Self-pay | Attending: Emergency Medicine | Admitting: Emergency Medicine

## 2020-05-06 ENCOUNTER — Emergency Department (HOSPITAL_COMMUNITY): Payer: Self-pay

## 2020-05-06 DIAGNOSIS — Z79899 Other long term (current) drug therapy: Secondary | ICD-10-CM | POA: Insufficient documentation

## 2020-05-06 DIAGNOSIS — L03211 Cellulitis of face: Secondary | ICD-10-CM | POA: Insufficient documentation

## 2020-05-06 DIAGNOSIS — K029 Dental caries, unspecified: Secondary | ICD-10-CM | POA: Insufficient documentation

## 2020-05-06 DIAGNOSIS — K047 Periapical abscess without sinus: Secondary | ICD-10-CM

## 2020-05-06 DIAGNOSIS — R5383 Other fatigue: Secondary | ICD-10-CM | POA: Insufficient documentation

## 2020-05-06 LAB — CBC WITH DIFFERENTIAL/PLATELET
Abs Immature Granulocytes: 0.05 10*3/uL (ref 0.00–0.07)
Basophils Absolute: 0.1 10*3/uL (ref 0.0–0.1)
Basophils Relative: 0 %
Eosinophils Absolute: 0.1 10*3/uL (ref 0.0–0.5)
Eosinophils Relative: 1 %
HCT: 40.6 % (ref 39.0–52.0)
Hemoglobin: 13.4 g/dL (ref 13.0–17.0)
Immature Granulocytes: 0 %
Lymphocytes Relative: 17 %
Lymphs Abs: 2 10*3/uL (ref 0.7–4.0)
MCH: 36.2 pg — ABNORMAL HIGH (ref 26.0–34.0)
MCHC: 33 g/dL (ref 30.0–36.0)
MCV: 109.7 fL — ABNORMAL HIGH (ref 80.0–100.0)
Monocytes Absolute: 1.3 10*3/uL — ABNORMAL HIGH (ref 0.1–1.0)
Monocytes Relative: 11 %
Neutro Abs: 7.9 10*3/uL — ABNORMAL HIGH (ref 1.7–7.7)
Neutrophils Relative %: 71 %
Platelets: 334 10*3/uL (ref 150–400)
RBC: 3.7 MIL/uL — ABNORMAL LOW (ref 4.22–5.81)
RDW: 13.2 % (ref 11.5–15.5)
WBC: 11.3 10*3/uL — ABNORMAL HIGH (ref 4.0–10.5)
nRBC: 0 % (ref 0.0–0.2)

## 2020-05-06 LAB — BASIC METABOLIC PANEL
Anion gap: 12 (ref 5–15)
BUN: 14 mg/dL (ref 6–20)
CO2: 28 mmol/L (ref 22–32)
Calcium: 9.5 mg/dL (ref 8.9–10.3)
Chloride: 103 mmol/L (ref 98–111)
Creatinine, Ser: 0.58 mg/dL — ABNORMAL LOW (ref 0.61–1.24)
GFR calc Af Amer: >60 mL/min (ref >60)
GFR calc non Af Amer: >60 mL/min (ref >60)
Glucose, Bld: 113 mg/dL — ABNORMAL HIGH (ref 70–99)
Potassium: 3.6 mmol/L (ref 3.5–5.1)
Sodium: 143 mmol/L (ref 135–145)

## 2020-05-06 MED ORDER — CLINDAMYCIN HCL 150 MG PO CAPS: 300.0000 mg | ORAL_CAPSULE | Freq: Three times a day (TID) | ORAL | 0 refills | Status: AC

## 2020-05-06 MED ORDER — IOHEXOL 300 MG/ML  SOLN
75.0000 mL | Freq: Once | INTRAMUSCULAR | Status: AC | PRN
  Administered 2020-05-06: 75 mL via INTRAVENOUS

## 2020-05-06 MED ORDER — MORPHINE SULFATE (PF) 2 MG/ML IV SOLN
2.0000 mg | Freq: Once | INTRAVENOUS | Status: AC
  Administered 2020-05-06: 2 mg via INTRAVENOUS
  Filled 2020-05-06: qty 1

## 2020-05-06 MED ORDER — CLINDAMYCIN HCL 150 MG PO CAPS
300.0000 mg | ORAL_CAPSULE | Freq: Once | ORAL | Status: AC
  Administered 2020-05-06: 300 mg via ORAL
  Filled 2020-05-06: qty 2

## 2020-05-06 MED ORDER — ACETAMINOPHEN 325 MG PO TABS
650.0000 mg | ORAL_TABLET | Freq: Once | ORAL | Status: AC
  Administered 2020-05-06: 650 mg via ORAL
  Filled 2020-05-06: qty 2

## 2020-05-06 NOTE — ED Notes (Signed)
PA in to triage to assess pt.

## 2020-05-06 NOTE — ED Provider Notes (Signed)
[facility] EMERGENCY DEPARTMENT Provider Note   CSN: [id number] Arrival date & time: 05/06/20  1618     History Chief Complaint  Patient presents with  . Oral Swelling    Christian Russell is a 49 y.o. male.  HPI     History reviewed. No pertinent past medical history.  Patient Active Problem List   Diagnosis Date Noted  . Low testosterone 06/13/2016  . Secondary male hypogonadism 06/13/2016  . Hyperlipidemia 06/13/2016    History reviewed. No pertinent surgical history.     Family History  Problem Relation Age of Onset  . Arthritis Mother        Osteoarthritis  . Arthritis Father   . COPD Father   . Diabetes Father   . Hypertension Father   . Heart disease Father     Social History   Tobacco Use  . Smoking status: Never Smoker  . Smokeless tobacco: Never Used  Substance Use Topics  . Alcohol use: Yes    Comment: occasional  . Drug use: No    Home Medications Prior to Admission medications   Medication Sig Start Date End Date Taking? Authorizing Provider  clindamycin (CLEOCIN) 150 MG capsule Take 2 capsules (300 mg total) by mouth every 8 (eight) hours for 7 days. 05/06/20 05/13/20  Gailen Shelter, PA  escitalopram (LEXAPRO) 5 MG tablet Take 1 tablet (5 mg total) by mouth daily. 06/19/16   Salley Scarlet, MD  predniSONE (DELTASONE) 20 MG tablet Take 1 tablet (20 mg total) by mouth daily with breakfast. 06/13/16   Salley Scarlet, MD    Allergies    Patient has no known allergies.  Review of Systems   Review of Systems  Constitutional: Positive for fatigue. Negative for chills and fever.  HENT: Positive for dental problem and facial swelling. Negative for congestion.   Respiratory: Negative for shortness of breath.   Cardiovascular: Negative for chest pain.  Gastrointestinal: Negative for abdominal pain.  Musculoskeletal: Negative for neck pain.    Physical Exam Updated Vital Signs BP 128/89 (BP Location: Right Arm)   Pulse 92   Temp 98.4  F (36.9 C) (Oral)   Resp 15   Wt 70.8 kg   SpO2 98%   BMI 26.78 kg/m   Physical Exam Vitals and nursing note reviewed.  Constitutional:      General: He is not in acute distress.    Appearance: Normal appearance. He is not ill-appearing.  HENT:     Head: Normocephalic and atraumatic.      Comments: Swelling and tenderness to palpation of the right side of the face and the encircled area above.  There is some swelling just below the eyelid.  EOMI, PERRLA, no proptosis.  No true trismus but patient is unable to fully open his mouth without significant pain.    Mouth/Throat:     Mouth: Mucous membranes are moist.     Comments: Severely eroded dentition throughout mouth.  Right upper molars are completely eroded to the gumline.  There is small periapical abscess on the right upper lateral gumline. Eyes:     General: No scleral icterus.       Right eye: No discharge.        Left eye: No discharge.     Conjunctiva/sclera: Conjunctivae normal.  Cardiovascular:     Rate and Rhythm: Tachycardia present.  Pulmonary:     Effort: Pulmonary effort is normal.     Breath sounds: No stridor.  Skin:  General: Skin is warm and dry.     Capillary Refill: Capillary refill takes less than 2 seconds.  Neurological:     Mental Status: He is alert and oriented to person, place, and time. Mental status is at baseline.     ED Results / Procedures / Treatments   Labs (all labs ordered are listed, but only abnormal results are displayed) Labs Reviewed  CBC WITH DIFFERENTIAL/PLATELET - Abnormal; Notable for the following components:      Result Value   WBC 11.3 (*)    RBC 3.70 (*)    MCV 109.7 (*)    MCH 36.2 (*)    Neutro Abs 7.9 (*)    Monocytes Absolute 1.3 (*)    All other components within normal limits  BASIC METABOLIC PANEL - Abnormal; Notable for the following components:   Glucose, Bld 113 (*)    Creatinine, Ser 0.58 (*)    All other components within normal limits     EKG None  Radiology CT Soft Tissue Neck W Contrast  Result Date: 05/06/2020 CLINICAL DATA:  49 year old male with facial swelling on the right around the mandible for 2 days. Possible dental abscess. EXAM: CT NECK WITH CONTRAST TECHNIQUE: Multidetector CT imaging of the neck was performed using the standard protocol following the bolus administration of intravenous contrast. CONTRAST:  46mL OMNIPAQUE IOHEXOL 300 MG/ML  SOLN COMPARISON:  Brain MRI 06/12/2016. FINDINGS: Pharynx and larynx: The glottis is closed. Laryngeal and pharyngeal soft tissue contours are within normal limits. Negative parapharyngeal and retropharyngeal spaces. Salivary glands: Negative sublingual space. Fairly symmetric appearing increased bilateral submandibular space lymph nodes (level 1B) up to 11 mm short axis. Asymmetric soft tissue stranding and thickening of the platysma on the right around the mandible and superficial to the right masticator space. But the submandibular glands appear symmetric and do not appear inflamed. No soft tissue gas or fluid collection is identified. See also dental details below. Parotid glands are within normal limits. Thyroid: Negative. Lymph nodes: Reactive appearing bilateral level 1 B lymph nodes described above. Smaller 7-8 mm bilateral level 2 lymph nodes. Smaller lymph nodes elsewhere. No cystic or necrotic nodes. Vascular: Major vascular structures in the neck and at the skull base appear patent including the right IJ. Dominant appearing left vertebral artery. No atherosclerosis identified. Limited intracranial: Negative. Visualized orbits: Negative. Mastoids and visualized paranasal sinuses: Moderate mucosal thickening throughout the right maxillary alveolar recess, see right maxillary molar findings below. Other paranasal sinuses are well pneumatized. There is a trace fluid level in the right sphenoid sinus. Visible tympanic cavities and mastoids are clear. Skeleton: Extensive bilateral  carious dentition. Carious anterior right maxillary molar with periapical lucency projecting into the right maxillary alveolar recess (sagittal image 37). Left mandible periapical lucency with cortical breakthrough at the posterior bicuspid (coronal image 31). But no inflammation in that region. No other acute osseous abnormality. No significant degenerative changes in the visible spine. Upper chest: Negative. IMPRESSION: 1. Right Face Cellulitis, which might be odontogenic (see #2) although no obvious single dental source. And no abscess or complicating features. 2. Extensive bilateral carious dentition. Probable odontogenic right maxillary sinus disease. 3. Reactive appearing bilateral level 1 and level 2 lymph nodes. Electronically Signed   By: Odessa Fleming M.D.   On: 05/06/2020 18:45    Procedures Procedures (including critical care time)  Medications Ordered in ED Medications  acetaminophen (TYLENOL) tablet 650 mg (650 mg Oral Given 05/06/20 1853)  morphine 2 MG/ML injection 2 mg (  2 mg Intravenous Given 05/06/20 1853)  iohexol (OMNIPAQUE) 300 MG/ML solution 75 mL (75 mLs Intravenous Contrast Given 05/06/20 1834)  clindamycin (CLEOCIN) capsule 300 mg (300 mg Oral Given 05/06/20 1941)    ED Course  I have reviewed the triage vital signs and the nursing notes.  Pertinent labs & imaging results that were available during my care of the patient were reviewed by me and considered in my medical decision making (see chart for details).    MDM Rules/Calculators/A&P                           Patient is 49 year old male presenting today with right-sided dental pain and facial swelling.  He has some difficulty chewing over the last several days.  He states that he has only been able to eat applesauce and some pured food.  On exam patient does have swelling and pain to palpation of the right side of his face and extends up above his maxilla but not into the periorbital region.  No proptosis or ophthalmoplegia on  my examination.  EOMI.  Doubt orbital cellulitis.  Questionable trismus may be secondary to pain.  We will provide patient with analgesia and reassess.  Will obtain CT scan of the soft tissue neck with contrast to evaluate for dental abscess.  CT scan with contrast shows right face cellulitis which may or may not be odontogenic although there is no obvious dental source patient does have very significantly poor dentition and seems to pain radiating from his gumline and there are periapical abscesses present.  For this reason will provide patient with clindamycin.  He will follow-up with dentistry.   The medical records were personally reviewed by myself. I personally reviewed all lab results and interpreted all imaging studies and either concurred with their official read or contacted radiology for clarification. Additional history obtained from old records.  This patient appears reasonably screened and I doubt any other medical condition requiring further workup, evaluation, or treatment in the ED at this time prior to discharge.   Patient's vitals are WNL apart from vital sign abnormalities discussed above, patient is in NAD, and able to ambulate in the ED at their baseline and able to tolerate PO.  Pain has been managed or a plan has been made for home management and has no complaints prior to discharge. Patient is comfortable with above plan and for discharge at this time. All questions were answered prior to disposition. Results from the ER workup discussed with the patient face to face and all questions answered to the best of my ability. The patient is safe for discharge with strict return precautions. Patient appears safe for discharge with appropriate follow-up. Conveyed my impression with the patient and they voiced understanding and are agreeable to plan.   An After Visit Summary was printed and given to the patient.  Portions of this note were generated with Scientist, clinical (histocompatibility and immunogenetics).  Dictation errors may occur despite best attempts at proofreading.  I discussed this case with my attending physician who cosigned this note including patient's presenting symptoms, physical exam, and planned diagnostics and interventions. Attending physician stated agreement with plan or made changes to plan which were implemented.   Patient given a copy of his CT scan report.  He understands the importance of dental follow-up.  He will complete the entire course of clindamycin.  He was given his first dose here and will fill his prescription for morning.  Discharged  with clindamycin.  He will use Tylenol ibuprofen for pain.  He was given on-call dentist information for follow-up.  Given return precautions.  Final Clinical Impression(s) / ED Diagnoses Final diagnoses:  Dental infection  Facial cellulitis    Rx / DC Orders ED Discharge Orders         Ordered    clindamycin (CLEOCIN) 150 MG capsule  Every 8 hours        05/06/20 1933           Gailen ShelterFondaw, Leelynd Maldonado S, [place] 05/06/20 2315    Benjiman CorePickering, Nathan, MD 05/06/20 2332

## 2020-05-06 NOTE — Discharge Instructions (Addendum)
Please take the antibiotics that you are as prescribed.  Please take them for the entire course.  You may take Tylenol and ibuprofen as described below.  Please use Tylenol or ibuprofen for pain.  You may use 600 mg ibuprofen every 6 hours or 1000 mg of Tylenol every 6 hours.  You may choose to alternate between the 2.  This would be most effective.  Not to exceed 4 g of Tylenol within 24 hours.  Not to exceed 3200 mg ibuprofen 24 hours.  Please drink plenty of water.  Please monitor your symptoms.  You may return to the emergency department for any new or concerning symptoms however this may take several days to begin feeling better.

## 2020-05-06 NOTE — ED Triage Notes (Signed)
Pt reports right sided dental pain x2 days. Pt reports oral swelling that increased since last night. Pt denies any injury. Airway patent. nad noted.

## 2021-06-06 ENCOUNTER — Encounter (HOSPITAL_COMMUNITY): Payer: Self-pay | Admitting: Emergency Medicine

## 2021-06-06 ENCOUNTER — Inpatient Hospital Stay (HOSPITAL_COMMUNITY)
Admission: EM | Admit: 2021-06-06 | Discharge: 2021-06-16 | DRG: 065 | Disposition: A | Discharge status: Rehabilitation Facility | Payer: BC Managed Care – PPO | Attending: Internal Medicine | Admitting: Internal Medicine

## 2021-06-06 ENCOUNTER — Emergency Department (HOSPITAL_COMMUNITY): Payer: BC Managed Care – PPO

## 2021-06-06 ENCOUNTER — Other Ambulatory Visit: Payer: Self-pay

## 2021-06-06 DIAGNOSIS — Z833 Family history of diabetes mellitus: Secondary | ICD-10-CM | POA: Diagnosis not present

## 2021-06-06 DIAGNOSIS — Z825 Family history of asthma and other chronic lower respiratory diseases: Secondary | ICD-10-CM

## 2021-06-06 DIAGNOSIS — R29708 NIHSS score 8: Secondary | ICD-10-CM | POA: Diagnosis present

## 2021-06-06 DIAGNOSIS — Z8249 Family history of ischemic heart disease and other diseases of the circulatory system: Secondary | ICD-10-CM | POA: Diagnosis not present

## 2021-06-06 DIAGNOSIS — I169 Hypertensive crisis, unspecified: Secondary | ICD-10-CM | POA: Diagnosis present

## 2021-06-06 DIAGNOSIS — M549 Dorsalgia, unspecified: Secondary | ICD-10-CM | POA: Diagnosis present

## 2021-06-06 DIAGNOSIS — R471 Dysarthria and anarthria: Secondary | ICD-10-CM | POA: Diagnosis present

## 2021-06-06 DIAGNOSIS — R531 Weakness: Secondary | ICD-10-CM | POA: Diagnosis not present

## 2021-06-06 DIAGNOSIS — I6389 Other cerebral infarction: Secondary | ICD-10-CM | POA: Diagnosis not present

## 2021-06-06 DIAGNOSIS — Z20822 Contact with and (suspected) exposure to covid-19: Secondary | ICD-10-CM | POA: Diagnosis present

## 2021-06-06 DIAGNOSIS — I619 Nontraumatic intracerebral hemorrhage, unspecified: Secondary | ICD-10-CM | POA: Diagnosis present

## 2021-06-06 DIAGNOSIS — I69251 Hemiplegia and hemiparesis following other nontraumatic intracranial hemorrhage affecting right dominant side: Secondary | ICD-10-CM | POA: Diagnosis not present

## 2021-06-06 DIAGNOSIS — Z79899 Other long term (current) drug therapy: Secondary | ICD-10-CM

## 2021-06-06 DIAGNOSIS — I10 Essential (primary) hypertension: Secondary | ICD-10-CM | POA: Diagnosis present

## 2021-06-06 DIAGNOSIS — R29810 Facial weakness: Secondary | ICD-10-CM | POA: Diagnosis present

## 2021-06-06 DIAGNOSIS — I610 Nontraumatic intracerebral hemorrhage in hemisphere, subcortical: Principal | ICD-10-CM

## 2021-06-06 DIAGNOSIS — G8191 Hemiplegia, unspecified affecting right dominant side: Secondary | ICD-10-CM | POA: Diagnosis present

## 2021-06-06 DIAGNOSIS — Z7952 Long term (current) use of systemic steroids: Secondary | ICD-10-CM

## 2021-06-06 DIAGNOSIS — G8929 Other chronic pain: Secondary | ICD-10-CM | POA: Diagnosis present

## 2021-06-06 DIAGNOSIS — F32A Depression, unspecified: Secondary | ICD-10-CM | POA: Diagnosis present

## 2021-06-06 DIAGNOSIS — Z8261 Family history of arthritis: Secondary | ICD-10-CM

## 2021-06-06 DIAGNOSIS — R4701 Aphasia: Secondary | ICD-10-CM | POA: Diagnosis present

## 2021-06-06 DIAGNOSIS — E785 Hyperlipidemia, unspecified: Secondary | ICD-10-CM | POA: Diagnosis present

## 2021-06-06 DIAGNOSIS — G4700 Insomnia, unspecified: Secondary | ICD-10-CM | POA: Diagnosis present

## 2021-06-06 DIAGNOSIS — F5104 Psychophysiologic insomnia: Secondary | ICD-10-CM | POA: Diagnosis not present

## 2021-06-06 HISTORY — DX: Other specified health status: Z78.9

## 2021-06-06 LAB — CBC
HCT: 44.8 % (ref 39.0–52.0)
Hemoglobin: 15 g/dL (ref 13.0–17.0)
MCH: 34.6 pg — ABNORMAL HIGH (ref 26.0–34.0)
MCHC: 33.5 g/dL (ref 30.0–36.0)
MCV: 103.5 fL — ABNORMAL HIGH (ref 80.0–100.0)
Platelets: 313 10*3/uL (ref 150–400)
RBC: 4.33 MIL/uL (ref 4.22–5.81)
RDW: 12.9 % (ref 11.5–15.5)
WBC: 8.2 10*3/uL (ref 4.0–10.5)
nRBC: 0 % (ref 0.0–0.2)

## 2021-06-06 LAB — RESP PANEL BY RT-PCR (FLU A&B, COVID) ARPGX2
Influenza A by PCR: NEGATIVE
Influenza B by PCR: NEGATIVE
SARS Coronavirus 2 by RT PCR: NEGATIVE

## 2021-06-06 LAB — PROTIME-INR
INR: 1 (ref 0.8–1.2)
Prothrombin Time: 13.3 seconds (ref 11.4–15.2)

## 2021-06-06 LAB — DIFFERENTIAL
Abs Immature Granulocytes: 0.03 10*3/uL (ref 0.00–0.07)
Basophils Absolute: 0.1 10*3/uL (ref 0.0–0.1)
Basophils Relative: 1 %
Eosinophils Absolute: 0.2 10*3/uL (ref 0.0–0.5)
Eosinophils Relative: 2 %
Immature Granulocytes: 0 %
Lymphocytes Relative: 21 %
Lymphs Abs: 1.8 10*3/uL (ref 0.7–4.0)
Monocytes Absolute: 0.6 10*3/uL (ref 0.1–1.0)
Monocytes Relative: 7 %
Neutro Abs: 5.6 10*3/uL (ref 1.7–7.7)
Neutrophils Relative %: 69 %

## 2021-06-06 LAB — COMPREHENSIVE METABOLIC PANEL
ALT: 27 U/L (ref 0–44)
AST: 28 U/L (ref 15–41)
Albumin: 4.3 g/dL (ref 3.5–5.0)
Alkaline Phosphatase: 76 U/L (ref 38–126)
Anion gap: 7 (ref 5–15)
BUN: 11 mg/dL (ref 6–20)
CO2: 26 mmol/L (ref 22–32)
Calcium: 8.7 mg/dL — ABNORMAL LOW (ref 8.9–10.3)
Chloride: 103 mmol/L (ref 98–111)
Creatinine, Ser: 0.84 mg/dL (ref 0.61–1.24)
GFR, Estimated: >60 mL/min (ref >60)
Glucose, Bld: 118 mg/dL — ABNORMAL HIGH (ref 70–99)
Potassium: 4.1 mmol/L (ref 3.5–5.1)
Sodium: 136 mmol/L (ref 135–145)
Total Bilirubin: 0.6 mg/dL (ref 0.3–1.2)
Total Protein: 7.5 g/dL (ref 6.5–8.1)

## 2021-06-06 LAB — I-STAT CHEM 8, ED
BUN: 10 mg/dL (ref 6–20)
Calcium, Ion: 1.18 mmol/L (ref 1.15–1.40)
Chloride: 103 mmol/L (ref 98–111)
Creatinine, Ser: 0.8 mg/dL (ref 0.61–1.24)
Glucose, Bld: 118 mg/dL — ABNORMAL HIGH (ref 70–99)
HCT: 45 % (ref 39.0–52.0)
Hemoglobin: 15.3 g/dL (ref 13.0–17.0)
Potassium: 4.4 mmol/L (ref 3.5–5.1)
Sodium: 140 mmol/L (ref 135–145)
TCO2: 27 mmol/L (ref 22–32)

## 2021-06-06 LAB — ETHANOL: Alcohol, Ethyl (B): <10 mg/dL (ref <10)

## 2021-06-06 LAB — APTT: aPTT: 32 seconds (ref 24–36)

## 2021-06-06 MED ORDER — IOHEXOL 350 MG/ML SOLN
100.0000 mL | Freq: Once | INTRAVENOUS | Status: AC | PRN
  Administered 2021-06-06: 100 mL via INTRAVENOUS

## 2021-06-06 MED ORDER — CLEVIDIPINE BUTYRATE 0.5 MG/ML IV EMUL
0.0000 mg/h | INTRAVENOUS | Status: DC
  Administered 2021-06-06: 1 mg/h via INTRAVENOUS
  Filled 2021-06-06 (×2): qty 50

## 2021-06-06 NOTE — ED Provider Notes (Signed)
[facility] EMERGENCY DEPARTMENT Provider Note   CSN: [id number] Arrival date & time: 06/06/21  2227     History Chief Complaint  Patient presents with   Code Stroke    Christian Russell is a 50 y.o. male.  Patient was last seen normal at 6 PM.  Patient fell at home and had slurred speech and could not move right arm or leg.  The history is provided by the patient, a relative and medical records. No language interpreter was used.  Weakness Severity:  Moderate Onset quality:  Sudden Timing:  Constant Progression:  Worsening Chronicity:  New Context: not alcohol use   Relieved by:  Nothing Worsened by:  Nothing     History reviewed. No pertinent past medical history.  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 No pertinent surgical history.     Family History  Problem Relation Age of Onset   Arthritis Mother        Osteoarthritis   Arthritis Father    COPD Father    Diabetes Father    Hypertension Father    Heart disease Father     Social History   Tobacco Use   Smoking status: Never   Smokeless tobacco: Never  Substance Use Topics   Alcohol use: Yes    Comment: occasional   Drug use: No    Home Medications Prior to Admission medications   Medication Sig Start Date End Date Taking? Authorizing Provider  escitalopram (LEXAPRO) 5 MG tablet Take 1 tablet (5 mg total) by mouth daily. 06/19/16   Salley Scarlet, MD  predniSONE (DELTASONE) 20 MG tablet Take 1 tablet (20 mg total) by mouth daily with breakfast. 06/13/16   Salley Scarlet, MD    Allergies    Patient has no known allergies.  Review of Systems   Review of Systems  Unable to perform ROS: Mental status change  Neurological:  Positive for weakness.   Physical Exam Updated Vital  Signs BP (!) 152/100   Pulse 68   Temp (!) 97.5 F (36.4 C)   Resp 16   SpO2 98%   Physical Exam Vitals and nursing note reviewed.  Constitutional:      Appearance: He is well-developed.  HENT:     Head: Normocephalic.     Nose: Nose normal.     Mouth/Throat:     Comments: Right sided mouth drooping Eyes:     General: No scleral icterus.    Conjunctiva/sclera: Conjunctivae normal.  Neck:     Thyroid: No thyromegaly.  Cardiovascular:     Rate and Rhythm: Normal rate and regular rhythm.     Heart sounds: No murmur heard.   No friction rub. No gallop.  Pulmonary:     Breath sounds: No stridor. No wheezing or rales.  Chest:     Chest wall: No tenderness.  Abdominal:     General: There is no distension.     Tenderness: There is no abdominal tenderness. There is no rebound.  Musculoskeletal:        General: Normal range of motion.     Cervical back: Neck supple.  Lymphadenopathy:     Cervical: No cervical adenopathy.  Skin:    Findings: No erythema or rash.  Neurological:     Mental Status: He is alert.  Motor: No abnormal muscle tone.     Coordination: Coordination normal.     Comments: Patient cannot lift right arm or right leg.  Patient also has slurred speech    ED Results / Procedures / Treatments   Labs (all labs ordered are listed, but only abnormal results are displayed) Labs Reviewed  CBC - Abnormal; Notable for the following components:      Result Value   MCV 103.5 (*)    MCH 34.6 (*)    All other components within normal limits  COMPREHENSIVE METABOLIC PANEL - Abnormal; Notable for the following components:   Glucose, Bld 118 (*)    Calcium 8.7 (*)    All other components within normal limits  I-STAT CHEM 8, ED - Abnormal; Notable for the following components:   Glucose, Bld 118 (*)    All other components within normal limits  RESP PANEL BY RT-PCR (FLU A&B, COVID) ARPGX2  ETHANOL  PROTIME-INR  APTT  DIFFERENTIAL  RAPID URINE DRUG SCREEN,  HOSP PERFORMED  URINALYSIS, ROUTINE W REFLEX MICROSCOPIC    EKG None  Radiology DG Pelvis Portable  Result Date: 06/06/2021 CLINICAL DATA:  Fall, altered mental status EXAM: PORTABLE PELVIS 1-2 VIEWS COMPARISON:  None. FINDINGS: There is no evidence of pelvic fracture or diastasis. No pelvic bone lesions are seen. IMPRESSION: Negative. Electronically Signed   By: Helyn Numbers M.D.   On: 06/06/2021 23:17   DG Chest Port 1 View  Result Date: 06/06/2021 CLINICAL DATA:  Fall, altered mental status EXAM: PORTABLE CHEST 1 VIEW COMPARISON:  None. FINDINGS: The heart size and mediastinal contours are within normal limits. Both lungs are clear. The visualized skeletal structures are unremarkable. IMPRESSION: No active disease. Electronically Signed   By: Helyn Numbers M.D.   On: 06/06/2021 23:17   CT HEAD CODE STROKE WO CONTRAST  Result Date: 06/06/2021 CLINICAL DATA:  Code stroke.  Sudden onset weakness EXAM: CT HEAD WITHOUT CONTRAST TECHNIQUE: Contiguous axial images were obtained from the base of the skull through the vertex without intravenous contrast. COMPARISON:  None. FINDINGS: Brain: Acute intraparenchymal hemorrhage of the left basal ganglia and posterior limb of the internal capsule, measuring 1.9 x 1.5 cm. There is mild surrounding edema. No midline shift or other significant mass effect. No hydrocephalus. Vascular: No abnormal hyperdensity of the major intracranial arteries or dural venous sinuses. No intracranial atherosclerosis. Skull: The visualized skull base, calvarium and extracranial soft tissues are normal. Sinuses/Orbits: No fluid levels or advanced mucosal thickening of the visualized paranasal sinuses. No mastoid or middle ear effusion. The orbits are normal. ASPECTS [facility] Stroke Program Early CT Score) is not applicable in the setting of acute hemorrhage. IMPRESSION: 1. Acute intraparenchymal hemorrhage of the left basal ganglia and posterior limb of the internal capsule,  measuring 1.9 x 1.5 cm. Mild surrounding edema. No midline shift or other significant mass effect. 2. ASPECTS is not applicable in the setting of acute hemorrhage. Critical Value/emergent results were called by telephone at the time of interpretation on 06/06/2021 at 10:44 pm to provider Mykael Batz , who verbally acknowledged these results. Electronically Signed   By: Deatra Robinson M.D.   On: 06/06/2021 22:44    Procedures Procedures   Medications Ordered in ED Medications  clevidipine (CLEVIPREX) infusion 0.5 mg/mL (4 mg/hr Intravenous Rate/Dose Change 06/06/21 2313)  iohexol (OMNIPAQUE) 350 MG/ML injection 100 mL (has no administration in time range)    ED Course  I have reviewed the triage vital signs and the nursing  notes.  Pertinent labs & imaging results that were available during my care of the patient were reviewed by me and considered in my medical decision making (see chart for details).   CRITICAL CARE Performed by: Bethann Berkshire Total critical care time: 40 minutes Critical care time was exclusive of separately billable procedures and treating other patients. Critical care was necessary to treat or prevent imminent or life-threatening deterioration. Critical care was time spent personally by me on the following activities: development of treatment plan with patient and/or surrogate as well as nursing, discussions with consultants, evaluation of patient's response to treatment, examination of patient, obtaining history from patient or surrogate, ordering and performing treatments and interventions, ordering and review of laboratory studies, ordering and review of radiographic studies, pulse oximetry and re-evaluation of patient's condition. I spoke with neurology.  Dr.  Derry Lory and he stated he will admit the patient over [facility] for intracerebral bleed.  He is started on a Cleviprex drip to keep his blood pressure under 140/90.  CT angio of the head neck  pending MDM Rules/Calculators/A&P                           Patient will be admitted to [facility] for intracerebral hemorrhage Final Clinical Impression(s) / ED Diagnoses Final diagnoses:  Acute intracerebral hemorrhage [facility])    Rx / DC Orders ED Discharge Orders     None        Bethann Berkshire, MD 06/06/21 2342

## 2021-06-06 NOTE — ED Notes (Signed)
Patient transported to CT 

## 2021-06-06 NOTE — Progress Notes (Signed)
Brief Neuro Note:  Discussed this patient with Dr. Estell Harpin with the ED team. Briefly, [name] is a 50 y.o. male who presented with R sided weakness, facial droop and slurred speech with a LKW of 1800 on 06/06/21. STAT CTH w/o contrast with a left basal ganglia ICH with mild surrounding edema. No intraventricular extension. No hx of anticoagulation. Platelet count is above 100k. Urine drug screen is pending.  Recs: - Transfer to [facility] and admit to ICU under Stroke service. - Stability scan in 6 hours or STAT with any neurological decline - Frequent neuro checks; q7min for 1 hour, then q1hour - No antiplatelets or anticoagulants due to ICH. - Blood pressure control with goal systolic 120 - 140 and diastolic less than 90. Use cleverplex and labetalol PRN - MRI brain with and without contrast when stabilized to evaluate for underlying mass - CTA head and neck.  Erick Blinks [facility] Pager Number [phone]

## 2021-06-06 NOTE — ED Triage Notes (Signed)
Pt last known normal was 1800. Pt's family heard loud noise and checked on pt  around 2200 and he was in the floor with right sided weakness and facial droop.

## 2021-06-06 NOTE — ED Notes (Signed)
CODE STROKE PAGED 

## 2021-06-06 NOTE — Consult Note (Signed)
[facility] TeleNeurology Consult Services  Date of Service:   06/06/2021 22:41:42  Diagnosis:       I61.9 - Intracerebral haemorrhage, unspecified  Impression: [name] is a 50 year-old man with a PMH of HLD who was BIBEMS for right sided hemiplegia and and expressive aphasia. Head CT revealed a left BG ICH.  Metrics: Last Known Well: 06/06/2021 18:00:00 [facility] Notification Time: 06/06/2021 22:41:42 Arrival Time: 06/06/2021 22:27:00 Stamp Time: 06/06/2021 22:41:42 Initial Response Time: 06/06/2021 22:46:26 Symptoms: Right sided hemiplegia and aphasia. NIHSS Start Assessment Time: 06/06/2021 22:52:20 Patient is not a candidate for Thrombolytic. Thrombolytic Medical Decision: 06/06/2021 22:52:01 Patient was not deemed candidate for Thrombolytic because of following reasons: Current or Previous ICH.  CT head was reviewed and results were: I personally Reviewed the CT Head and it Showed left BG bleed  ED Physician notified of diagnostic impression and management plan on 06/06/2021 23:18:09   Recommendation:  Diagnostic Studies:      Repeat CT head in first 8-12hrs      CTA head and neck with contrast  Laboratory Studies:       INR/PT       aPTT?       CBC  Medications:       Hold?antiplatelet?therapy/NSAIDS/Anticoagulation       Load with Keppra 1gm now.       Keppra 500mg  bid.  Nursing Recommendations:       Telemetry, IV Fluids?Avoid dextrose containing fluids, Maintain euglycemia       Head of bed 30 degrees       Neuro checks q1-2?hrs?during ICU stay       Once stable neuro checks q4?hrs       keep BP less than 140/90's with goal of 130/80s  Consultations:       Need Neurosurgery consultation?STAT       Recommend Speech therapy if failed dysphagia screen       Physical therapy/Occupational therapy  DVT Prophylaxis:       SCDs  Disposition:       Neurology will  Follow  ------------------------------------------------------------------------------  History of Present Illness: Patient is a 50 year old Male.  Patient was brought by EMS for symptoms of Right sided hemiplegia and aphasia.  Mr. 42 is a 50 year-old man with a PMH of HLD who was BIBEMS for right sided hemiplegia and and expressive aphasia. As per ER staff, EMS reported that his family heard him fall and than found that his speech did not make sense and he was unable to move the right side of his body.   Past Medical History:      Hyperlipidemia      There is NO history of Hypertension      There is NO history of Diabetes Mellitus      There is NO history of Atrial Fibrillation      There is NO history of Coronary Artery Disease      There is NO history of Stroke  Anticoagulant use:  No  Antiplatelet use: No     Examination: BP(165/88), Pulse(69), 1A: Level of Consciousness - Alert; keenly responsive + 0 1B: Ask Month and Age - Aphasic + 2 1C: Blink Eyes & Squeeze Hands - Performs 1 Task + 1 2: Test Horizontal Extraocular Movements - Normal + 0 3: Test Visual Fields - No Visual Loss + 0 4: Test Facial Palsy (Use Grimace if Obtunded) - [place] paralysis (flat nasolabial fold, smile asymmetry) + 1 5A: Test Left Arm  Motor Drift - No Drift for 10 Seconds + 0 5B: Test Right Arm Motor Drift - No Movement + 4 6A: Test Left Leg Motor Drift - No Drift for 5 Seconds + 0 6B: Test Right Leg Motor Drift - No Drift for 5 Seconds + 0 7: Test Limb Ataxia (FNF/Heel-Shin) - No Ataxia + 0 8: Test Sensation - Normal; No sensory loss + 0 9: Test Language/Aphasia - Mute/Global Aphasia: No Usable Speech/Auditory Comprehension + 3 10: Test Dysarthria - Normal + 0 11: Test Extinction/Inattention - No abnormality + 0 NIHSS Score: 11  ICH Score: 0   GlasGow Coma Score: 13-15 (0)  Age >= 80: No (0)  ICH volume >= 51mL: No (0)  Intraventricular hemorrhage: No (0)  Infratentorial  origin of hemorrhage: No (0)  Pre-Morbid Modified Rankin Scale: 0 Points = No symptoms at all   Patient/Family was informed the Neurology Consult would occur via TeleHealth consult by way of interactive audio and video telecommunications and consented to receiving care in this manner.   Dr Wilford Sports  [facility] [phone] Case [id number]

## 2021-06-07 ENCOUNTER — Encounter (HOSPITAL_COMMUNITY): Payer: Self-pay | Admitting: Neurology

## 2021-06-07 ENCOUNTER — Inpatient Hospital Stay (HOSPITAL_COMMUNITY): Payer: BC Managed Care – PPO

## 2021-06-07 DIAGNOSIS — I6389 Other cerebral infarction: Secondary | ICD-10-CM | POA: Diagnosis not present

## 2021-06-07 DIAGNOSIS — I619 Nontraumatic intracerebral hemorrhage, unspecified: Secondary | ICD-10-CM | POA: Diagnosis present

## 2021-06-07 LAB — RAPID URINE DRUG SCREEN, HOSP PERFORMED
Amphetamines: NOT DETECTED
Barbiturates: NOT DETECTED
Benzodiazepines: NOT DETECTED
Cocaine: NOT DETECTED
Opiates: POSITIVE — AB
Tetrahydrocannabinol: POSITIVE — AB

## 2021-06-07 LAB — ECHOCARDIOGRAM COMPLETE
AR max vel: 3.21 cm2
AV Area VTI: 3.38 cm2
AV Area mean vel: 3.07 cm2
AV Mean grad: 3 mmHg
AV Peak grad: 5.7 mmHg
Ao pk vel: 1.19 m/s
Area-P 1/2: 2.94 cm2
S' Lateral: 2.6 cm

## 2021-06-07 LAB — URINALYSIS, ROUTINE W REFLEX MICROSCOPIC
Bilirubin Urine: NEGATIVE
Glucose, UA: NEGATIVE mg/dL
Hgb urine dipstick: NEGATIVE
Ketones, ur: NEGATIVE mg/dL
Leukocytes,Ua: NEGATIVE
Nitrite: NEGATIVE
Protein, ur: NEGATIVE mg/dL
Specific Gravity, Urine: 1.019 (ref 1.005–1.030)
pH: 5 (ref 5.0–8.0)

## 2021-06-07 LAB — CBG MONITORING, ED: Glucose-Capillary: 106 mg/dL — ABNORMAL HIGH (ref 70–99)

## 2021-06-07 LAB — LIPID PANEL
Cholesterol: 205 mg/dL — ABNORMAL HIGH (ref 0–200)
HDL: 63 mg/dL (ref >40)
LDL Cholesterol: 111 mg/dL — ABNORMAL HIGH (ref 0–99)
Total CHOL/HDL Ratio: 3.3 RATIO
Triglycerides: 153 mg/dL — ABNORMAL HIGH (ref <150)
VLDL: 31 mg/dL (ref 0–40)

## 2021-06-07 LAB — HIV ANTIBODY (ROUTINE TESTING W REFLEX): HIV Screen 4th Generation wRfx: NONREACTIVE

## 2021-06-07 LAB — HEMOGLOBIN A1C
Hgb A1c MFr Bld: 5.4 % (ref 4.8–5.6)
Mean Plasma Glucose: 108.28 mg/dL

## 2021-06-07 LAB — MRSA NEXT GEN BY PCR, NASAL: MRSA by PCR Next Gen: NOT DETECTED

## 2021-06-07 MED ORDER — ACETAMINOPHEN 325 MG PO TABS
650.0000 mg | ORAL_TABLET | ORAL | Status: DC | PRN
  Administered 2021-06-07 – 2021-06-11 (×4): 650 mg via ORAL
  Filled 2021-06-07 (×4): qty 2

## 2021-06-07 MED ORDER — LABETALOL HCL 5 MG/ML IV SOLN
20.0000 mg | Freq: Once | INTRAVENOUS | Status: DC
  Filled 2021-06-07: qty 4

## 2021-06-07 MED ORDER — ACETAMINOPHEN 650 MG RE SUPP: 650.0000 mg | RECTAL | Status: DC | PRN

## 2021-06-07 MED ORDER — STROKE: EARLY STAGES OF RECOVERY BOOK
Freq: Once | Status: AC
  Filled 2021-06-07: qty 1

## 2021-06-07 MED ORDER — PANTOPRAZOLE SODIUM 40 MG IV SOLR
40.0000 mg | Freq: Every day | INTRAVENOUS | Status: DC
  Administered 2021-06-07: 40 mg via INTRAVENOUS
  Filled 2021-06-07: qty 40

## 2021-06-07 MED ORDER — SENNOSIDES-DOCUSATE SODIUM 8.6-50 MG PO TABS
1.0000 | ORAL_TABLET | Freq: Two times a day (BID) | ORAL | Status: DC
  Administered 2021-06-07 – 2021-06-14 (×6): 1 via ORAL
  Filled 2021-06-07 (×14): qty 1

## 2021-06-07 MED ORDER — SODIUM CHLORIDE 0.9 % IV SOLN: INTRAVENOUS | Status: AC

## 2021-06-07 MED ORDER — AMLODIPINE BESYLATE 5 MG PO TABS
5.0000 mg | ORAL_TABLET | Freq: Every day | ORAL | Status: DC
  Administered 2021-06-07 – 2021-06-16 (×9): 5 mg via ORAL
  Filled 2021-06-07 (×9): qty 1

## 2021-06-07 MED ORDER — ATORVASTATIN CALCIUM 80 MG PO TABS
80.0000 mg | ORAL_TABLET | Freq: Every day | ORAL | Status: DC
Start: 2021-06-07 — End: 2021-06-16
  Administered 2021-06-07 – 2021-06-16 (×10): 80 mg via ORAL
  Filled 2021-06-07 (×10): qty 1

## 2021-06-07 MED ORDER — ACETAMINOPHEN 160 MG/5ML PO SOLN: 650.0000 mg | ORAL | Status: DC | PRN

## 2021-06-07 MED ORDER — CHLORHEXIDINE GLUCONATE CLOTH 2 % EX PADS
6.0000 | MEDICATED_PAD | Freq: Every day | CUTANEOUS | Status: DC
  Administered 2021-06-07: 6 via TOPICAL

## 2021-06-07 MED ORDER — CLEVIDIPINE BUTYRATE 0.5 MG/ML IV EMUL
0.0000 mg/h | INTRAVENOUS | Status: DC
Start: 2021-06-07 — End: 2021-06-09
  Administered 2021-06-07: 12 mg/h via INTRAVENOUS
  Administered 2021-06-07: 2 mg/h via INTRAVENOUS
  Filled 2021-06-07 (×2): qty 50

## 2021-06-07 MED ORDER — LISINOPRIL 2.5 MG PO TABS
5.0000 mg | ORAL_TABLET | Freq: Every day | ORAL | Status: DC
  Administered 2021-06-07 – 2021-06-11 (×5): 5 mg via ORAL
  Filled 2021-06-07: qty 2
  Filled 2021-06-07 (×2): qty 1
  Filled 2021-06-07 (×2): qty 2

## 2021-06-07 NOTE — Evaluation (Signed)
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   Assessment / Plan / Recommendation Clinical Impression  Pt was irritable at frequent disturbances and interruption of sleep - was willing to participate in brief assessment, but demonstrated frustration and poor effort.  Speech was characterized primarily by a moderate dysathria with imprecision and fast rate.  Cues to slow down and increase volume were met with resistance.  Expressive language testing revealed potential deficits in divergent naming, but pt did not complete several of the tasks required of him.  Output was fluent; repetition intact. He followed one and two step commands; demonstrated difficulty with three-step commands.  Responses were impulsive; pragmatics/social communication was impacted by pt's irritability.  Recommend SLP f/u for dysarthria and to complete further assessment as able.    SLP Assessment  SLP Recommendation/Assessment: Patient needs continued Speech [place] [facility] SLP Visit Diagnosis: Dysarthria and anarthria (R47.1)    Recommendations for follow up therapy are one component of a multi-disciplinary discharge planning  process, led by the attending physician.  Recommendations may be updated based on patient status, additional functional criteria and insurance authorization.    Follow Up Recommendations  Other (comment) (tba)    Frequency and Duration min 2x/week  2 weeks      SLP Evaluation Cognition  Overall Cognitive Status: Difficult to assess Arousal/Alertness: Awake/alert Orientation Level: Oriented X4       Comprehension  Visual Recognition/Discrimination Discrimination: Not tested Reading Comprehension Reading Status: Not tested    Expression Expression Primary Mode of Expression: Verbal Verbal Expression Overall Verbal Expression: Appears within functional limits for tasks assessed Initiation: No impairment Automatic Speech: Name;Social Response Level of Generative/Spontaneous Verbalization: Conversation Repetition: No impairment Naming: Impairment Responsive: 76-100% accurate Divergent: 0-24% accurate Other Naming Comments:  (poor effort) Pragmatics: Impairment Written Expression Written Expression: Not tested   Oral / Motor  Motor Speech Overall Motor Speech: Impaired Respiration: Within functional limits Phonation: Normal Resonance: Within functional limits Intelligibility: Intelligibility reduced Word: 75-100% accurate Phrase: 50-74% accurate Sentence: 50-74% accurate Conversation: 50-74% accurate Motor Planning: Witnin functional limits   GO                    Christian Russell 06/07/2021, 3:08 PM  Christian Russell, [place] Office number [phone] Pager [phone]

## 2021-06-07 NOTE — Progress Notes (Signed)
eLink Physician-Brief Progress Note Patient Name: Christian Russell DOB: January 26, 1971 MRN: [id number]   Date of Service  06/07/2021  HPI/Events of Note  50 yr old male with no PMHX presents with right weakness and dysarthria and found to have L basal ganglia ICH.  Presently stable from hemodynamic and resp standpoint.    eICU Interventions  Chart reviewed     Intervention Category Evaluation Type: New Patient Evaluation  [name], P 06/07/2021, 2:50 AM

## 2021-06-07 NOTE — H&P (Addendum)
NEUROLOGY CONSULTATION NOTE   Date of service: June 07, 2021 Patient Name: Christian Russell MRN:  [id number] DOB:  Aug 24, 1971 _ _ _   _ __   _ __ _ _  __ __   _ __   __ _  History of Present Illness  Christian Russell is a 50 y.o. male   has a past medical history of Medical history non-contributory. who presents with R sided weakness and dysarthria.  He was in his usual state of health when around 1800 on 06/06/21, he was walking to the bathroom and fell down and was weak on the right side. He could not get up. Family checked on him and called EMS.  No prior hx of strokes, not on anticoagulation, no antiplatelet, no EtOH use. No smoking, no recreational substances.  He initially presented to [facility] ED where a STAT CTH w/o contrast demonstrated left basal ganglia ICH. He was started on Cleviprex for blood pressure control and transferred for neurology evaluation and ICU admission at [facility].   mRS: 0 tNK/thrombectomy: Not a candidate 2/2 ICH LKW: 1800 on 06/06/21 ICH score of 0 NIHSS components Score: Comment  1a Level of Conscious 0[x]  1[]  2[]  3[]      1b LOC Questions 0[x]  1[]  2[]       1c LOC Commands 0[x]  1[]  2[]       2 Best Gaze 0[x]  1[]  2[]       3 Visual 0[x]  1[]  2[]  3[]      4 Facial Palsy 0[]  1[x]  2[]  3[]      5a Motor Arm - left 0[x]  1[]  2[]  3[]  4[]  UN[]    5b Motor Arm - Right 0[]  1[]  2[]  3[x]  4[]  UN[]    6a Motor Leg - Left 0[x]  1[]  2[]  3[]  4[]  UN[]    6b Motor Leg - Right 0[]  1[]  2[x]  3[]  4[]  UN[]    7 Limb Ataxia 0[x]  1[]  2[]  3[]  UN[]     8 Sensory 0[]  1[x]  2[]  UN[]      9 Best Language 0[x]  1[]  2[]  3[]      10 Dysarthria 0[]  1[x]  2[]  UN[]      11 Extinct. and Inattention 0[x]  1[]  2[]       TOTAL: 8      ROS   Constitutional Denies weight loss, fever and chills.   HEENT Denies changes in vision and hearing.   Respiratory Denies SOB and cough.   CV Denies palpitations and CP   GI Denies abdominal pain, nausea, vomiting and diarrhea.   GU Denies dysuria and urinary  frequency.   MSK Denies myalgia and joint pain.   Skin Denies rash and pruritus.   Neurological Denies headache and syncope.  Psychiatric Denies recent changes in mood. Denies anxiety and depression.   Past History   Past Medical History:  Diagnosis Date  . Medical history non-contributory    Past Surgical History:  Procedure Laterality Date  . NO PAST SURGERIES     Family History  Problem Relation Age of Onset  . Arthritis Mother        Osteoarthritis  . Arthritis Father   . COPD Father   . Diabetes Father   . Hypertension Father   . Heart disease Father    Social History   Socioeconomic History  . Marital status: Single    Spouse name: Not on file  . Number of children: Not on file  . Years of education: Not on file  . Highest education level: Not on file  Occupational History  . Not on  file  Tobacco Use  . Smoking status: Never  . Smokeless tobacco: Never  Substance and Sexual Activity  . Alcohol use: Not Currently    Comment: occasional  . Drug use: No  . Sexual activity: Yes  Other Topics Concern  . Not on file  Social History Narrative  . Not on file   Social Determinants of Health   Financial Resource Strain: Not on file  Food Insecurity: Not on file  Transportation Needs: Not on file  Physical Activity: Not on file  Stress: Not on file  Social Connections: Not on file   No Known Allergies  Medications   Medications Prior to Admission  Medication Sig Dispense Refill Last Dose  . escitalopram (LEXAPRO) 5 MG tablet Take 1 tablet (5 mg total) by mouth daily. 30 tablet 1   . predniSONE (DELTASONE) 20 MG tablet Take 1 tablet (20 mg total) by mouth daily with breakfast. 5 tablet 0      Vitals   Vitals:   06/06/21 2345 06/07/21 0000 06/07/21 0015 06/07/21 0025  BP: (!) 148/90 135/90 135/88 132/84  Pulse: 82 85 86 93  Resp: 13 20 17 14   Temp:      SpO2: 97% 97% 99% 100%     There is no height or weight on file to calculate BMI.  Physical  Exam   General: Laying comfortably in bed; in no acute distress.  HENT: Normal oropharynx and mucosa. Normal external appearance of ears and nose.  Neck: Supple, no pain or tenderness  CV: No JVD. No peripheral edema.  Pulmonary: Symmetric Chest rise. Normal respiratory effort.  Abdomen: Soft to touch, non-tender.  Ext: No cyanosis, edema, or deformity  Skin: No rash. Normal palpation of skin.   Musculoskeletal: Normal digits and nails by inspection. No clubbing.   Neurologic Examination  Mental status/Cognition: Alert, oriented to self, place, month and year, good attention.  Speech/language: dysarthric speech, fluent, comprehension intact, object naming intact, repetition intact.  Cranial nerves:   CN II Pupils equal and reactive to light, no VF deficits    CN III,IV,VI EOM intact, no gaze preference or deviation, no nystagmus    CN V normal sensation in V1, V2, and V3 segments bilaterally    CN VII R facial droop   CN VIII normal hearing to speech    CN IX & X normal palatal elevation, no uvular deviation    CN XI 5/5 head turn and 5/5 shoulder shrug bilaterally    CN XII midline tongue protrusion    Motor:  Muscle bulk: normal, tone flaccid in LUE Mvmt Root Nerve  Muscle Right Left Comments  SA C5/6 Ax Deltoid 0 5   EF C5/6 Mc Biceps 3 5   EE C6/7/8 Rad Triceps 2 5   WF C6/7 Med FCR     WE C7/8 PIN ECU     F Ab C8/T1 U ADM/FDI 3 5   HF L1/2/3 Fem Illopsoas 3 5   KE L2/3/4 Fem Quad 2 5   DF L4/5 D Peron Tib Ant 1 5   PF S1/2 Tibial Grc/Sol 2 5    Reflexes:  Right Left Comments  Pectoralis      Biceps (C5/6) 2+ 2+   Brachioradialis (C5/6) 2+ 2+    Triceps (C6/7) 2+ 2+    Patellar (L3/4) 3 3    Achilles (S1) 4 2    Hoffman      Plantar     Jaw jerk    Sensation:  Light touch Decreased in dorsal aspect of R hand.   Pin prick    Temperature    Vibration   Proprioception    Coordination/Complex Motor:  - Finger to Nose intact on the left - Heel to shin unable  to do 2/2 weakness. - Rapid alternating movement are intact on the left. - Gait: unable and unsafe to assess with profound R sided weakness.  Labs   CBC:  Recent Labs  Lab 06/06/21 2233 06/06/21 2234  WBC 8.2  --   NEUTROABS 5.6  --   HGB 15.0 15.3  HCT 44.8 45.0  MCV 103.5*  --   PLT 313  --     Basic Metabolic Panel:  Lab Results  Component Value Date   NA 140 06/06/2021   NA 136 06/06/2021   K 4.4 06/06/2021   K 4.1 06/06/2021   CO2 26 06/06/2021   GLUCOSE 118 (H) 06/06/2021   GLUCOSE 118 (H) 06/06/2021   BUN 10 06/06/2021   BUN 11 06/06/2021   CREATININE 0.80 06/06/2021   CREATININE 0.84 06/06/2021   CALCIUM 8.7 (L) 06/06/2021   GFRNONAA >60 06/06/2021   GFRAA >60 05/06/2020   Lipid Panel:  Lab Results  Component Value Date   LDLCALC 111 05/26/2016   HgbA1c:  Lab Results  Component Value Date   HGBA1C 5.2 06/02/2016   Urine Drug Screen:     Component Value Date/Time   LABOPIA POSITIVE (A) 06/06/2021 2251   COCAINSCRNUR NONE DETECTED 06/06/2021 2251   LABBENZ NONE DETECTED 06/06/2021 2251   AMPHETMU NONE DETECTED 06/06/2021 2251   THCU POSITIVE (A) 06/06/2021 2251   LABBARB NONE DETECTED 06/06/2021 2251    Alcohol Level     Component Value Date/Time   ETH <10 06/06/2021 2233    CT Head without contrast: Personally reviewed and notable for Left basal ganglia ICH  CT angio Head and Neck with contrast: No aneurysm, no vascular malformation.  MRI Brain w + w/o C: Pending.  Impression   Christian Russell is a 50 y.o. 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 The appearance and the location of the bleed is most suggestive of hypertensive bleed.  Primary Diagnosis:  Left Basal Ganglia ICH  Secondary Diagnosis: Cerebral edema and Essential (primary) hypertension  Recommendations   Left basal ganglia ICH: - Admit to ICU - Stability scan in 6 hours or STAT with any  neurological decline - Frequent neuro checks; q33min for 1 hour, then q1hour - No antiplatelets or anticoagulants due to ICH - SCD for DVT prophylaxis, pharmacological DVT ppx at 24 hours if ICH is stable - Blood pressure control with goal systolic 120 - 140, cleverplex and labetalol PRN - Stroke labs, HgbA1c, fasting lipid panel - MRI brain with and without contrast when stabilized to evaluate for underlying mass - Risk factor modification - Echocardiogram - PT consult, OT consult, Speech consult. - Stroke team to follow  Full code. Would like his mother to be his HCPOA if he is unable to make decisions by himself.  ______________________________________________________________________   This patient is critically ill and at significant risk of neurological worsening, death and care requires constant monitoring of vital signs, hemodynamics,respiratory and cardiac monitoring, neurological assessment, discussion with family, other specialists and medical decision making of high complexity. I spent 35 minutes of neurocritical care time  in the care of  this patient. This was time spent independent  of any time provided by nurse practitioner or PA.  Erick Blinks [facility] Pager Number [phone] 06/07/2021  2:27 AM   Thank you for the opportunity to take part in the care of this patient. If you have any further questions, please contact the neurology consultation attending.  Signed,  Erick Blinks [facility] Pager Number [phone] _ _ _   _ __   _ __ _ _  __ __   _ __   __ _

## 2021-06-07 NOTE — Progress Notes (Addendum)
1000 Spoke with MRI, unable to take patient at this time. MRI to call when table available.   1430 called MRI for update, tentatively scheduled for 1530.   1630 pt unable to finish MRI w/wo d/t tolerance and anxiety. Return to unit at this time. MD P. Sethi made aware, will address in AM per MD.

## 2021-06-07 NOTE — Progress Notes (Signed)
OT Cancellation Note  Patient Details Name: BLAYTON HUTTNER MRN: [id number] DOB: 24-Jun-1971   Cancelled Treatment:    Reason Eval/Treat Not Completed: Active bedrest order;Patient not medically ready  [facility] Luisa Dago, OT/L   Acute OT Clinical Specialist Acute Rehabilitation Services Pager [phone] Office [phone]  06/07/2021, 8:03 AM

## 2021-06-07 NOTE — Progress Notes (Addendum)
STROKE TEAM PROGRESS NOTE   INTERVAL HISTORY No one is at the bedside at time of exam. Complains of persistent right sided weakness and dysarthria .  Vital signs stable.  Blood pressure adequately controlled.  Repeat CT scan of the head shows stable appearance of left basal ganglia hemorrhage without intraventricular extension no significant cytotoxic edema or midline shift.  CT angiogram of the brain and neck show no significant large vessel stenosis or occlusion aneurysms or AVM.  Urine drug screen is positive for cannabis.  LDL cholesterol 1 1 1  mg percent.  Hemoglobin A1c is 5.4.  Vitals:   06/07/21 0630 06/07/21 0645 06/07/21 0700 06/07/21 0800  BP: 122/86 (!) 117/93 132/89   Pulse: 91 94 95   Resp: 17 20 19    Temp:    99 F (37.2 C)  TempSrc:    Axillary  SpO2: 94% 94% 96%    CBC:  Recent Labs  Lab 06/06/21 2233 06/06/21 2234  WBC 8.2  --   NEUTROABS 5.6  --   HGB 15.0 15.3  HCT 44.8 45.0  MCV 103.5*  --   PLT 313  --    Basic Metabolic Panel:  Recent Labs  Lab 06/06/21 2234  NA 136  140  K 4.1  4.4  CL 103  103  CO2 26  GLUCOSE 118*  118*  BUN 11  10  CREATININE 0.84  0.80  CALCIUM 8.7*    Lipid Panel:  Recent Labs  Lab 06/07/21 0328  CHOL 205*  TRIG 153*  HDL 63  CHOLHDL 3.3  VLDL 31  LDLCALC 08/06/21*    HgbA1c:  Recent Labs  Lab 06/07/21 0328  HGBA1C 5.4   Urine Drug Screen:  Recent Labs  Lab 06/06/21 2251  LABOPIA POSITIVE*  COCAINSCRNUR NONE DETECTED  LABBENZ NONE DETECTED  AMPHETMU NONE DETECTED  THCU POSITIVE*  LABBARB NONE DETECTED    Alcohol Level  Recent Labs  Lab 06/06/21 2233  ETH <10    IMAGING past 24 hours CT ANGIO HEAD NECK W WO CM  Result Date: 06/07/2021 CLINICAL DATA:  Intracranial hemorrhage EXAM: CT ANGIOGRAPHY HEAD AND NECK TECHNIQUE: Multidetector CT imaging of the head and neck was performed using the standard protocol during bolus administration of intravenous contrast. Multiplanar CT image reconstructions  and MIPs were obtained to evaluate the vascular anatomy. Carotid stenosis measurements (when applicable) are obtained utilizing NASCET criteria, using the distal internal carotid diameter as the denominator. CONTRAST:  2234 OMNIPAQUE IOHEXOL 350 MG/ML SOLN COMPARISON:  None. FINDINGS: CTA NECK FINDINGS SKELETON: There is no bony spinal canal stenosis. No lytic or blastic lesion. OTHER NECK: Normal pharynx, larynx and major salivary glands. No cervical lymphadenopathy. Unremarkable thyroid gland. UPPER CHEST: No pneumothorax or pleural effusion. No nodules or masses. AORTIC ARCH: There is no calcific atherosclerosis of the aortic arch. There is no aneurysm, dissection or hemodynamically significant stenosis of the visualized portion of the aorta. Conventional 3 vessel aortic branching pattern. The visualized proximal subclavian arteries are widely patent. RIGHT CAROTID SYSTEM: Normal without aneurysm, dissection or stenosis. LEFT CAROTID SYSTEM: Normal without aneurysm, dissection or stenosis. VERTEBRAL ARTERIES: Left dominant configuration. Both origins are clearly patent. There is no dissection, occlusion or flow-limiting stenosis to the skull base (V1-V3 segments). CTA HEAD FINDINGS POSTERIOR CIRCULATION: --Vertebral arteries: Normal V4 segments. --Inferior cerebellar arteries: Normal. --Basilar artery: Normal. --Superior cerebellar arteries: Normal. --Posterior cerebral arteries (PCA): Normal. ANTERIOR CIRCULATION: --Intracranial internal carotid arteries: Normal. --Anterior cerebral arteries (ACA): Normal. Both A1 segments are  present. Patent anterior communicating artery (a-comm). --Middle cerebral arteries (MCA): Normal. VENOUS SINUSES: As permitted by contrast timing, patent. ANATOMIC VARIANTS: None Unchanged size of intraparenchymal hematoma measuring 1.9 x 1.0 x 3.0 cm. Review of the MIP images confirms the above findings. IMPRESSION: Normal CTA of the head and neck. No aneurysm or vascular malformation.  The intraparenchymal hematoma is most consistent with hypertensive hemorrhage. Electronically Signed   By: Deatra Robinson M.D.   On: 06/07/2021 00:05   CT HEAD WO CONTRAST  Result Date: 06/07/2021 CLINICAL DATA:  Follow-up intracranial hemorrhage EXAM: CT HEAD WITHOUT CONTRAST TECHNIQUE: Contiguous axial images were obtained from the base of the skull through the vertex without intravenous contrast. COMPARISON:  Head CT from yesterday FINDINGS: Brain: Band of hemorrhage in the left lateral thalamus and corona radiata/internal capsule measuring nearly 4 cm craniocaudal and 14 mm in thickness. No progression. Mild adjacent edema is stable. No evidence of interval hemorrhage or acute infarction. Chronic small vessel disease in the deep white matter. Vascular: No hyperdense vessel or unexpected calcification. Skull: Normal. Negative for fracture or focal lesion. Sinuses/Orbits: Chronic right-sided sinusitis with obstruction at the level of the nasal cavity. IMPRESSION: 1. No progression of the left basal ganglia and internal capsule hemorrhage. 2. Chronic right-sided sinusitis/obstruction. Electronically Signed   By: Tiburcio Pea M.D.   On: 06/07/2021 08:06   DG Pelvis Portable  Result Date: 06/06/2021 CLINICAL DATA:  Fall, altered mental status EXAM: PORTABLE PELVIS 1-2 VIEWS COMPARISON:  None. FINDINGS: There is no evidence of pelvic fracture or diastasis. No pelvic bone lesions are seen. IMPRESSION: Negative. Electronically Signed   By: Helyn Numbers M.D.   On: 06/06/2021 23:17   DG Chest Port 1 View  Result Date: 06/06/2021 CLINICAL DATA:  Fall, altered mental status EXAM: PORTABLE CHEST 1 VIEW COMPARISON:  None. FINDINGS: The heart size and mediastinal contours are within normal limits. Both lungs are clear. The visualized skeletal structures are unremarkable. IMPRESSION: No active disease. Electronically Signed   By: Helyn Numbers M.D.   On: 06/06/2021 23:17   CT HEAD CODE STROKE WO  CONTRAST  Result Date: 06/06/2021 CLINICAL DATA:  Code stroke.  Sudden onset weakness EXAM: CT HEAD WITHOUT CONTRAST TECHNIQUE: Contiguous axial images were obtained from the base of the skull through the vertex without intravenous contrast. COMPARISON:  None. FINDINGS: Brain: Acute intraparenchymal hemorrhage of the left basal ganglia and posterior limb of the internal capsule, measuring 1.9 x 1.5 cm. There is mild surrounding edema. No midline shift or other significant mass effect. No hydrocephalus. Vascular: No abnormal hyperdensity of the major intracranial arteries or dural venous sinuses. No intracranial atherosclerosis. Skull: The visualized skull base, calvarium and extracranial soft tissues are normal. Sinuses/Orbits: No fluid levels or advanced mucosal thickening of the visualized paranasal sinuses. No mastoid or middle ear effusion. The orbits are normal. ASPECTS [facility] Stroke Program Early CT Score) is not applicable in the setting of acute hemorrhage. IMPRESSION: 1. Acute intraparenchymal hemorrhage of the left basal ganglia and posterior limb of the internal capsule, measuring 1.9 x 1.5 cm. Mild surrounding edema. No midline shift or other significant mass effect. 2. ASPECTS is not applicable in the setting of acute hemorrhage. Critical Value/emergent results were called by telephone at the time of interpretation on 06/06/2021 at 10:44 pm to provider JOSEPH ZAMMIT , who verbally acknowledged these results. Electronically Signed   By: Deatra Robinson M.D.   On: 06/06/2021 22:44    PHYSICAL EXAM  General: Pleasant middle-aged Caucasian  male laying comfortably in bed; in no acute distress.  HENT: Normal oropharynx and mucosa. Normal external appearance of ears and nose.  Neck: Supple, no pain or tenderness  CV: No JVD. No peripheral edema.  Pulmonary: Symmetric Chest rise. Normal respiratory effort.  Abdomen: Soft to touch, non-tender.  Ext: No cyanosis, edema, or deformity  Skin: No rash.  Normal palpation of skin.   Musculoskeletal: Normal digits and nails by inspection. No clubbing.    Neurologic Examination  Mental status/Cognition: Alert, oriented to self, place, month and year, good attention.  Speech/language: dysarthric speech, fluent, comprehension intact, object naming intact, repetition intact.  Cranial nerves:   CN II Pupils equal and reactive to light, no VF deficits    CN III,IV,VI EOM intact, no gaze preference or deviation, no nystagmus    CN V normal sensation in V1, V2, and V3 segments bilaterally    CN VII R facial droop   CN VIII normal hearing to speech    CN IX & X normal palatal elevation, no uvular deviation    CN XI 5/5 head turn and 5/5 shoulder shrug bilaterally    CN XII midline tongue protrusion    Coordination/Complex Motor:  Right hemiparesis with grade 1/5 right upper extremity strength proximally and 0/5 distally.  Right lower extremity strength is 2-3/5 proximally and distally. - Finger to Nose intact on the left - Heel to shin unable to do 2/2 weakness. - Rapid alternating movement are intact on the left. - Gait: unable and unsafe to assess with profound R sided weakness.  ASSESSMENT/PLAN Mr. Dezmin Kittelson Whittier is a 50 y.o. male with no significant medical history who presents with R sided weakness and dysarthria. He initially presented to [facility] ED where a STAT CTH w/o contrast demonstrated left basal ganglia ICH. He was started on Cleviprex for blood pressure control and transferred for neurology evaluation and ICU admission at [facility]. MRI brain is pending.   Stroke , left basal ganglia hemorrhagic: Due to uncontrolled hypertension CT head: left basal ganglia and internal capsule hemorrhage  CTA head & neck:  Normal CTA of the head and neck. No aneurysm or vascular malformation. The intraparenchymal hematoma is most consistent with hypertensive hemorrhage. MRI  brain pending   2D Echo done and results are pending  LDL 111 HgbA1c  5.4 VTE prophylaxis - scd     Diet   Diet NPO time specified   No antithrombotic prior to admission, now on No antithrombotic. hemorrhage Therapy recommendations:   pending Disposition:  pending  Hypertension Home meds:  none Stable Permissive hypertension (OK if < 220/120) but gradually normalize in 5-7 days Long-term BP goal normotensive  Hyperlipidemia Home meds:  none LDL 111, goal < 70 Add lipitor   High intensity statin   Continue statin at discharge   HgbA1c 5.4, goal < 7.0 CBGs Recent Labs    06/06/21 2227  GLUCAP 106*    SSI  Other Stroke Risk Factors    Substance abuse - UDS:  THC POSITIVE, Cocaine NONE DETECTED. Patient advised to stop using due to stroke risk.   Hospital day # 1 I have personally obtained history,examined this patient, reviewed notes, independently viewed imaging studies, participated in medical decision making and plan of care.ROS completed by me personally and pertinent positives fully documented  I have made any additions or clarifications directly to the above note. Agree with note above.  He presented with right hemiplegia due to hypertensive left basal ganglia hemorrhage.  Continue close neurological monitoring and strict blood pressure control with systolic goal below 140 for the first 24 hours and then below 160.  Mobilize out of bed.  Therapy consults.  Check MRI scan of the brain, echocardiogram.  Patient counseled to quit smoking cigarettes and marijuana.  Will likely need inpatient rehab.  No family available at the bedside.This patient is critically ill and at significant risk of neurological worsening, death and care requires constant monitoring of vital signs, hemodynamics,respiratory and cardiac monitoring, extensive review of multiple databases, frequent neurological assessment, discussion with family, other specialists and medical decision making of high complexity.I have made any additions or clarifications directly to the above  note.This critical care time does not reflect procedure time, or teaching time or supervisory time of PA/NP/Med Resident etc but could involve care discussion time.  I spent 30 minutes of neurocritical care time  in the care of  this patient.      Delia Heady, MD Medical Director [facility] Pager: [phone] 06/07/2021 2:26 PM     To contact Stroke Continuity provider, please refer to [email or web address]. After hours, contact General Neurology

## 2021-06-07 NOTE — Progress Notes (Signed)
  Echocardiogram 2D Echocardiogram has been performed.  Christian Russell F 06/07/2021, 12:32 PM

## 2021-06-07 NOTE — Progress Notes (Signed)
CODE STROKE  Call Time   2218  EMS in [facility] [place] time   2228 Exam started   2232 Exam finished   2237 [facility]    2237 Epic    2237 Radiologist called  2237

## 2021-06-08 MED ORDER — DIPHENHYDRAMINE HCL 50 MG/ML IJ SOLN
25.0000 mg | Freq: Once | INTRAMUSCULAR | Status: AC
  Administered 2021-06-08: 25 mg via INTRAVENOUS
  Filled 2021-06-08: qty 1

## 2021-06-08 MED ORDER — PANTOPRAZOLE SODIUM 40 MG PO TBEC
40.0000 mg | DELAYED_RELEASE_TABLET | Freq: Every day | ORAL | Status: DC
  Administered 2021-06-08 – 2021-06-15 (×8): 40 mg via ORAL
  Filled 2021-06-08 (×8): qty 1

## 2021-06-08 MED ORDER — MELATONIN 3 MG PO TABS
3.0000 mg | ORAL_TABLET | Freq: Every day | ORAL | Status: DC
  Administered 2021-06-08 – 2021-06-15 (×8): 3 mg via ORAL
  Filled 2021-06-08 (×8): qty 1

## 2021-06-08 NOTE — Progress Notes (Signed)
Inpatient Rehab Admissions Coordinator Note:   Per OT recommendations patient was screened for CIR candidacy by Stephania Fragmin, PT. At this time, pt appears to be a potential candidate for CIR. I will place an order for rehab consult for full assessment, per our protocol.  Please contact me any with questions.Estill Dooms, PT, DPT [phone] 06/08/21 4:05 PM

## 2021-06-08 NOTE — Evaluation (Signed)
Physical Therapy Evaluation Patient Details Name: Christian Russell MRN: [id number] DOB: 17-Jul-1971 Today's Date: 06/08/2021  History of Present Illness  50 y.o. 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 PMH none  Clinical Impression  PTA, patient lives with mother and reports independence. Patient presents with R sided weakness, impaired balance, impaired sensation, impaired coordination, decreased activity tolerance, and impaired functional mobility. Patient requires modA+2 for stand pivot transfer bed>BSC>chair. Patient will benefit from skilled PT services during acute stay to address listed deficits. Recommend CIR following discharge for intensive therapies to assist with return to independence and safety prior to returning home.        Recommendations for follow up therapy are one component of a multi-disciplinary discharge planning process, led by the attending physician.  Recommendations may be updated based on patient status, additional functional criteria and insurance authorization.  Follow Up Recommendations CIR    Equipment Recommendations  Other (comment) (TBD)    Recommendations for Other Services Rehab consult     Precautions / Restrictions Precautions Precautions: Fall Restrictions Weight Bearing Restrictions: No      Mobility  Bed Mobility Overal bed mobility: Needs Assistance Bed Mobility: Supine to Sit;Rolling Rolling: Mod assist   Supine to sit: Mod assist     General bed mobility comments: pt pushing with L UE to attempting to exit the bed on the R Side. pt needs (A) to slide R LE off EOB. pt static sitting min (A)    Transfers Overall transfer level: Needs assistance Equipment used: 2 person hand held assist Transfers: Sit to/from [employer] Sit to Stand: +2 physical assistance;Mod assist Stand pivot transfers: +2 physical assistance;Mod assist        General transfer comment: pt with R side weakness but able to activate and not buckle  Ambulation/Gait                Stairs            Wheelchair Mobility    Modified Rankin (Stroke Patients Only)       Balance Overall balance assessment: Needs assistance Sitting-balance support: Single extremity supported;Feet supported Sitting balance-Leahy Scale: Fair     Standing balance support: Single extremity supported;No upper extremity supported Standing balance-Leahy Scale: Poor                               Pertinent Vitals/Pain Pain Assessment: Faces Faces Pain Scale: Hurts even more Pain Location: pain all over / generalized Pain Descriptors / Indicators: Discomfort Pain Intervention(s): Monitored during session    Home Living Family/patient expects to be discharged to:: Private residence Living Arrangements: Parent (mother)   Type of Home: Mobile home Home Access: Stairs to enter Entrance Stairs-Rails: Can reach both Entrance Stairs-Number of Steps: 6 Home Layout: One level Home Equipment: Shower seat;Grab bars - toilet;Hand held shower head;Bedside commode Additional Comments: does all the driving and yard work. Mother otherwise can care for herself, animal- dog named patches ( pitbull)    Prior Function Level of Independence: Independent         Comments: work as Chief of Staff   Dominant Hand: Right    Extremity/Trunk Assessment   Upper Extremity Assessment Upper Extremity Assessment: Defer to OT evaluation    Lower Extremity Assessment Lower Extremity Assessment: RLE deficits/detail RLE Deficits / Details: grossly  3/5 RLE Sensation: decreased light touch RLE Coordination: decreased fine motor;decreased gross motor    Cervical / Trunk Assessment Cervical / Trunk Assessment: Normal  Communication   Communication: No difficulties  Cognition Arousal/Alertness: Lethargic Behavior During Therapy: Flat  affect;Impulsive Overall Cognitive Status: Impaired/Different from baseline Area of Impairment: Following commands;Safety/judgement;Awareness                       Following Commands: Follows one step commands consistently;Follows one step commands with increased time Safety/Judgement: Decreased awareness of safety;Decreased awareness of deficits Awareness: Emergent   General Comments: pt reports "i am sorry i am not thinking clearly" pt very slow processing and flat affect.      General Comments General comments (skin integrity, edema, etc.): VSS RA    Exercises     Assessment/Plan    PT Assessment Patient needs continued PT services  PT Problem List Decreased strength;Decreased range of motion;Decreased activity tolerance;Decreased balance;Decreased coordination;Decreased mobility;Decreased safety awareness;Decreased knowledge of use of DME       PT Treatment Interventions DME instruction;Gait training;Stair training;Functional mobility training;Therapeutic activities;Balance training;Therapeutic exercise;Neuromuscular re-education;Patient/family education    PT Goals (Current goals can be found in the Care Plan section)  Acute Rehab PT Goals Patient Stated Goal: to return home walking PT Goal Formulation: With patient Time For Goal Achievement: 06/22/21 Potential to Achieve Goals: Good    Frequency Min 4X/week   Barriers to discharge        Co-evaluation PT/OT/SLP Co-Evaluation/Treatment: Yes Reason for Co-Treatment: Complexity of the patient's impairments (multi-system involvement);Necessary to address cognition/behavior during functional activity;To address functional/ADL transfers;For patient/therapist safety PT goals addressed during session: Mobility/safety with mobility;Balance         AM-PAC PT "6 Clicks" Mobility  Outcome Measure Help needed turning from your back to your side while in a flat bed without using bedrails?: A Lot Help needed moving  from lying on your back to sitting on the side of a flat bed without using bedrails?: A Lot Help needed moving to and from a bed to a chair (including a wheelchair)?: Total Help needed standing up from a chair using your arms (e.g., wheelchair or bedside chair)?: Total Help needed to walk in hospital room?: Total Help needed climbing 3-5 steps with a railing? : Total 6 Click Score: 8    End of Session   Activity Tolerance: Patient tolerated treatment well Patient left: in chair;with call bell/phone within reach;with chair alarm set Nurse Communication: Mobility status PT Visit Diagnosis: Unsteadiness on feet (R26.81);Muscle weakness (generalized) (M62.81);Other abnormalities of gait and mobility (R26.89);Hemiplegia and hemiparesis Hemiplegia - Right/Left: Right Hemiplegia - dominant/non-dominant: Dominant Hemiplegia - caused by: Cerebral infarction    Time: 3244-0102 PT Time Calculation (min) (ACUTE ONLY): 21 min   Charges:   PT Evaluation $PT Eval Moderate Complexity: 1 Mod          Shayle Donahoo A. Dan Humphreys PT, DPT Acute Rehabilitation Services Pager [phone] Office [phone]   Viviann Spare 06/08/2021, 3:15 PM

## 2021-06-08 NOTE — Evaluation (Signed)
Occupational Therapy Evaluation Patient Details Name: Christian Russell MRN: [id number] DOB: 01-28-1971 Today's Date: 06/08/2021   History of Present Illness 50 y.o. 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 PMH none   Clinical Impression   PT admitted with L basal ganglia ICH. Pt currently with functional limitiations due to the deficits listed below (see OT problem list). Pt progressed to bed <>BSC<>chair. Pt requires +2 (A) for safety with transfers Pt will benefit from skilled OT to increase their independence and safety with adls and balance to allow discharge CIR.       Recommendations for follow up therapy are one component of a multi-disciplinary discharge planning process, led by the attending physician.  Recommendations may be updated based on patient status, additional functional criteria and insurance authorization.   Follow Up Recommendations  CIR    Equipment Recommendations  3 in 1 bedside commode    Recommendations for Other Services Rehab consult     Precautions / Restrictions Precautions Precautions: Fall Restrictions Weight Bearing Restrictions: No      Mobility Bed Mobility Overal bed mobility: Needs Assistance Bed Mobility: Supine to Sit;Rolling Rolling: Mod assist   Supine to sit: Mod assist     General bed mobility comments: pt pushign with L UE to attempting to exit the bed on the R Side. pt needs (A) to slide R LE off eOB. pt static sitting min (A)    Transfers Overall transfer level: Needs assistance Equipment used: 2 person hand held assist Transfers: Sit to/from [employer] Sit to Stand: +2 physical assistance;Mod assist Stand pivot transfers: +2 physical assistance;Mod assist       General transfer comment: pt with R side weakness but able to activate and not buckle    Balance Overall balance assessment: Needs assistance Sitting-balance  support: Single extremity supported;Feet supported Sitting balance-Leahy Scale: Fair     Standing balance support: Single extremity supported;No upper extremity supported Standing balance-Leahy Scale: Poor                             ADL either performed or assessed with clinical judgement   ADL Overall ADL's : Needs assistance/impaired Eating/Feeding: Maximal assistance;Sitting Eating/Feeding Details (indicate cue type and reason): able to initiate with ataxic movement Grooming: Maximal assistance;Sitting   Upper Body Bathing: Maximal assistance;Sitting   Lower Body Bathing: Maximal assistance;Sit to/from stand   Upper Body Dressing : Maximal assistance   Lower Body Dressing: Maximal assistance   Toilet Transfer: +2 for physical assistance;Moderate assistance;Stand-pivot   Toileting- Clothing Manipulation and Hygiene: Total assistance         General ADL Comments: pt very flat and somewhat appears emotional about current situation. pt makes statement "i guess she will be taking care of me" in reference to mother. pt once in chair and activing R UE LE appears to be more hopeful and responsive. pt thanking therapist for helping him move     Vision Baseline Vision/History: 0 No visual deficits Vision Assessment?: Yes Eye Alignment: Within Functional Limits Ocular Range of Motion: Within Functional Limits Alignment/Gaze Preference: Within Defined Limits Tracking/Visual Pursuits: Able to track stimulus in all quads without difficulty Convergence: Impaired (comment)     Perception Perception Perception Tested?: Yes Perception Deficits: Inattention/neglect Inattention/Neglect: Does not attend to right visual field   Praxis      Pertinent Vitals/Pain Pain Assessment: Faces  Faces Pain Scale: Hurts even more Pain Location: pain all over / generalized Pain Descriptors / Indicators: Discomfort Pain Intervention(s): Monitored during session;Repositioned      Hand Dominance Right   Extremity/Trunk Assessment Upper Extremity Assessment Upper Extremity Assessment: RUE deficits/detail RUE Deficits / Details: AROM digits wrist elbow and scapula during session but with weakness. pt unable to sustain shoulder flexion against gravity. pt unable to sustain a grasp. pt able to initiate shoulder elevation but not sustain. RUE Sensation: decreased light touch RUE Coordination: decreased fine motor;decreased gross motor   Lower Extremity Assessment Lower Extremity Assessment: RLE deficits/detail   Cervical / Trunk Assessment Cervical / Trunk Assessment: Normal   Communication Communication Communication: No difficulties   Cognition Arousal/Alertness: Lethargic Behavior During Therapy: Flat affect Overall Cognitive Status: Impaired/Different from baseline Area of Impairment: Following commands;Safety/judgement;Awareness                       Following Commands: Follows one step commands consistently;Follows one step commands with increased time Safety/Judgement: Decreased awareness of safety;Decreased awareness of deficits Awareness: Emergent   General Comments: pt reports "i am sorry i am not thinking clearly" pt very slow processing and flat affect.   General Comments  VSS RA    Exercises Exercises: Hand exercises   Shoulder Instructions      Home Living Family/patient expects to be discharged to:: Private residence Living Arrangements: Parent (mother)   Type of Home: Mobile home Home Access: Stairs to enter Entrance Stairs-Number of Steps: 6 Entrance Stairs-Rails: Can reach both Home Layout: One level     Bathroom Shower/Tub: Chief Strategy Officer: Standard     Home Equipment: Shower seat;Grab bars - toilet;Hand held shower head;Bedside commode   Additional Comments: does all the driving and yard work. Mother otherwise can care for herself, animal- dog named patches ( pitbull)  Lives With:  Family    Prior Functioning/Environment Level of Independence: Independent        Comments: work as Patent examiner Problem List: Decreased strength;Decreased range of motion;Decreased activity tolerance;Impaired balance (sitting and/or standing);Decreased coordination;Decreased cognition;Decreased safety awareness;Decreased knowledge of use of DME or AE;Decreased knowledge of precautions;Impaired sensation;Impaired UE functional use;Pain      OT Treatment/Interventions: Self-care/ADL training;Therapeutic exercise;Neuromuscular education;Energy conservation;DME and/or AE instruction;Manual therapy;Modalities;Therapeutic activities;Cognitive remediation/compensation;Visual/perceptual remediation/compensation;Patient/family education;Balance training    OT Goals(Current goals can be found in the care plan section) Acute Rehab OT Goals Patient Stated Goal: to return home walking OT Goal Formulation: With patient Time For Goal Achievement: 06/22/21 Potential to Achieve Goals: Good  OT Frequency: Min 2X/week   Barriers to D/C:    mother will (A)       Co-evaluation PT/OT/SLP Co-Evaluation/Treatment: Yes Reason for Co-Treatment: Complexity of the patient's impairments (multi-system involvement);Necessary to address cognition/behavior during functional activity;For patient/therapist safety;To address functional/ADL transfers   OT goals addressed during session: ADL's and self-care;Proper use of Adaptive equipment and DME;Strengthening/ROM      AM-PAC OT "6 Clicks" Daily Activity     Outcome Measure Help from another person eating meals?: A Lot Help from another person taking care of personal grooming?: A Lot Help from another person toileting, which includes using toliet, bedpan, or urinal?: A Lot Help from another person bathing (including washing, rinsing, drying)?: A Lot Help from another person to put on and taking off regular upper body clothing?: A Lot Help from  another person to put on and taking off regular  lower body clothing?: A Lot 6 Click Score: 12   End of Session Equipment Utilized During Treatment: Gait belt Nurse Communication: Mobility status;Precautions  Activity Tolerance: Patient tolerated treatment well Patient left: in chair;with call bell/phone within reach;with chair alarm set  OT Visit Diagnosis: Unsteadiness on feet (R26.81);Muscle weakness (generalized) (M62.81);Hemiplegia and hemiparesis Hemiplegia - Right/Left: Right Hemiplegia - dominant/non-dominant: Dominant Hemiplegia - caused by: Cerebral infarction                Time: 1009-1026 OT Time Calculation (min): 17 min Charges:  OT General Charges $OT Visit: 1 Visit OT Evaluation $OT Eval Moderate Complexity: 1 Mod   Brynn, OTR/L  Acute Rehabilitation Services Pager: [phone] Office: [phone] .   Mateo Flow 06/08/2021, 11:01 AM

## 2021-06-08 NOTE — Progress Notes (Addendum)
STROKE TEAM PROGRESS NOTE   INTERVAL HISTORY No one is at the bedside at time of exam. Complains of persistent right sided weakness and dysarthria .  Vital signs stable.  Blood pressure adequately controlled.   MRI scan of the brain shows stable appearance of left basal ganglia hemorrhage but was limited as patient not able to cooperate to complete the study due to postcontrast imaging.  Patient has been seen by therapies who recommended inpatient rehab.  Neurological exam is unchanged Vitals:   06/08/21 0900 06/08/21 1000 06/08/21 1100 06/08/21 1200  BP: 125/74 127/83 (!) 137/98 (!) 147/94  Pulse: 90 90 98 91  Resp: 17 (!) 8 11 18   Temp:   99.3 F (37.4 C) 98.2 F (36.8 C)  TempSrc:   Oral Oral  SpO2: 96% 95% 96% 98%  Height:       CBC:  Recent Labs  Lab 06/06/21 2233 06/06/21 2234  WBC 8.2  --   NEUTROABS 5.6  --   HGB 15.0 15.3  HCT 44.8 45.0  MCV 103.5*  --   PLT 313  --     Basic Metabolic Panel:  Recent Labs  Lab 06/06/21 2234  NA 136  140  K 4.1  4.4  CL 103  103  CO2 26  GLUCOSE 118*  118*  BUN 11  10  CREATININE 0.84  0.80  CALCIUM 8.7*     Lipid Panel:  Recent Labs  Lab 06/07/21 0328  CHOL 205*  TRIG 153*  HDL 63  CHOLHDL 3.3  VLDL 31  LDLCALC 08/07/21*     HgbA1c:  Recent Labs  Lab 06/07/21 0328  HGBA1C 5.4    Urine Drug Screen:  Recent Labs  Lab 06/06/21 2251  LABOPIA POSITIVE*  COCAINSCRNUR NONE DETECTED  LABBENZ NONE DETECTED  AMPHETMU NONE DETECTED  THCU POSITIVE*  LABBARB NONE DETECTED     Alcohol Level  Recent Labs  Lab 06/06/21 2233  ETH <10     IMAGING past 24 hours MR BRAIN WO CONTRAST  Result Date: 06/07/2021 CLINICAL DATA:  Concern for brain mass or lesion, intracranial hemorrhage EXAM: MRI HEAD WITHOUT CONTRAST TECHNIQUE: Multiplanar, multiecho pulse sequences of the brain and surrounding structures were obtained without intravenous contrast. COMPARISON:  06/12/2016. Correlation is also made with CT head  06/07/2021. FINDINGS: Evaluation is somewhat limited by the absence of intravenous contrast, as the patient could not tolerate continuing the exam through the contrasted portions. Brain: Redemonstrated area of intraparenchymal hemorrhage in the left middle cerebral peduncle, thalamus, internal capsule, and corona radiata, measuring approximately 2.1 x 1.4 x 3.9 cm (series 6, image 15 and series 5, image 17). This area is associated with peripheral restricted diffusion, likely infarct, and surrounding increased T2 signal, consistent with edema, which extends into the left midbrain. Additional foci of susceptibility in the posterior left temporal lobe (series 7, image 10, unchanged from 2017), posterior right lentiform nucleus (series 7, image 11), and in the left occipital lobe (series 7, image 12). No evidence of intraventricular hemorrhage, significant mass effect, or midline shift. No hydrocephalus or extra-axial collection. No definite underlying mass lesion is seen. Vascular: Normal flow voids. Skull and upper cervical spine: Normal marrow signal. 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 Status post left maxillary antrostomy. The orbits are unremarkable. Other: Fluid and left-greater-than-right mastoid air cells. IMPRESSION: Evaluation is somewhat limited by the absence of intravenous contrast, as the patient could not tolerate continuing the exam through the contrasted  portions. Within this limitation, no underlying lesion is seen in association with left thalamic, internal capsule, and corona radiata hemorrhage. Electronically Signed   By: Wiliam Ke M.D.   On: 06/07/2021 17:25    PHYSICAL EXAM  General: Pleasant middle-aged Caucasian male laying comfortably in bed; in no acute distress.  HENT: Normal oropharynx and mucosa. Normal external appearance of ears and nose.  Neck: Supple, no pain or tenderness  CV: No JVD. No peripheral edema.   Pulmonary: Symmetric Chest rise. Normal respiratory effort.  Abdomen: Soft to touch, non-tender.  Ext: No cyanosis, edema, or deformity  Skin: No rash. Normal palpation of skin.   Musculoskeletal: Normal digits and nails by inspection. No clubbing.    Neurologic Examination  Mental status/Cognition: Alert, oriented to self, place, month and year, good attention.  Speech/language: dysarthric speech, fluent, comprehension intact, object naming intact, repetition intact.  Cranial nerves:   CN II Pupils equal and reactive to light, no VF deficits    CN III,IV,VI EOM intact, no gaze preference or deviation, no nystagmus    CN V normal sensation in V1, V2, and V3 segments bilaterally    CN VII R facial droop   CN VIII normal hearing to speech    CN IX & X normal palatal elevation, no uvular deviation    CN XI 5/5 head turn and 5/5 shoulder shrug bilaterally    CN XII midline tongue protrusion    Coordination/Complex Motor:  Right hemiparesis with grade 1/5 right upper extremity strength proximally and 0/5 distally.  Right lower extremity strength is 2-3/5 proximally and distally. - Finger to Nose intact on the left - Heel to shin unable to do 2/2 weakness. - Rapid alternating movement are intact on the left. - Gait: unable and unsafe to assess with profound R sided weakness.  ASSESSMENT/PLAN [name] is a 50 y.o. male with no significant medical history who presents with R sided weakness and dysarthria. He initially presented to [facility] ED where a STAT CTH w/o contrast demonstrated left basal ganglia ICH. He was started on Cleviprex for blood pressure control and transferred for neurology evaluation and ICU admission at [facility]. MRI brain is pending.   Stroke , left basal ganglia hemorrhagic: Due to uncontrolled hypertension CT head: left basal ganglia and internal capsule hemorrhage  CTA head & neck:  Normal CTA of the head and neck. No aneurysm or vascular malformation.  The intraparenchymal hematoma is most consistent with hypertensive hemorrhage. MRI  brain stable appearance of the 2.1 x 1.4 x 3.9 cm left basal ganglia hemorrhage extending into thalamus, and left middle cerebral peduncle with mild surrounding edema but no intraventricular extension.  2D Echo left ventricular ejection fraction 60 to 65%. LDL 111 HgbA1c 5.4 VTE prophylaxis - scd     Diet   Diet regular Room service appropriate? Yes with Assist; Fluid consistency: Thin   No antithrombotic prior to admission, now on No antithrombotic. hemorrhage Therapy recommendations:   CLR  disposition:  pending  Hypertension Home meds:  none Stable Permissive hypertension (OK if < 220/120) but gradually normalize in 5-7 days Long-term BP goal normotensive  Hyperlipidemia Home meds:  none LDL 111, goal < 70 Add lipitor   High intensity statin   Continue statin at discharge   HgbA1c 5.4, goal < 7.0 CBGs Recent Labs    06/06/21 2227  GLUCAP 106*     SSI  Other Stroke Risk Factors    Substance abuse - UDS:  THC POSITIVE, Cocaine NONE DETECTED. Patient advised to stop using due to stroke risk.   Hospital day # 2   I have personally obtained history,examined this patient, reviewed notes, independently viewed imaging studies, participated in medical decision making and plan of care.ROS completed by me personally and pertinent positives fully documented  I have made any additions or clarifications directly to the above note. Agree with note above.  Continue strict blood pressure control with systolic goal below 160, ongoing therapies, close neurological monitoring.  Transfer to neurology stepdown bed.  Consult medical hospitalist team to take over care.This patient is critically ill and at significant risk of neurological worsening, death and care requires constant monitoring of vital signs, hemodynamics,respiratory and cardiac monitoring, extensive review of multiple databases, frequent  neurological assessment, discussion with family, other specialists and medical decision making of high complexity.I have made any additions or clarifications directly to the above note.This critical care time does not reflect procedure time, or teaching time or supervisory time of PA/NP/Med Resident etc but could involve care discussion time.  I spent 30 minutes of neurocritical care time  in the care of  this patient.     Delia Heady, MD Medical Director [facility] Stroke Center Pager: [phone] 06/08/2021 4:24 PM   To contact Stroke Continuity provider, please refer to [email or web address]. After hours, contact General Neurology

## 2021-06-09 DIAGNOSIS — I10 Essential (primary) hypertension: Secondary | ICD-10-CM

## 2021-06-09 MED ORDER — TRAZODONE HCL 50 MG PO TABS
50.0000 mg | ORAL_TABLET | Freq: Every day | ORAL | Status: DC
  Administered 2021-06-09: 50 mg via ORAL
  Filled 2021-06-09: qty 1

## 2021-06-09 MED ORDER — LORAZEPAM 0.5 MG PO TABS
0.5000 mg | ORAL_TABLET | Freq: Once | ORAL | Status: AC | PRN
  Administered 2021-06-10: 0.5 mg via ORAL
  Filled 2021-06-09: qty 1

## 2021-06-09 NOTE — Progress Notes (Signed)
Physical Therapy Treatment Patient Details Name: Christian Russell MRN: [id number] DOB: Sep 03, 1970 Today's Date: 06/09/2021   History of Present Illness 50 y.o. 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 PMH: Depression, HLD.    PT Comments    Pt received in supine, sleeping but awoken with effort and participatory in therapy session with heavy encouragement. 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 Once at EOB, pt alert and encouraged by his mobility progress and able to progress to lateral scooting/squat pivot transfer to drop arm chair with min guard at most and standing transfers/dynamic standing tasks with L HW and up to +2 modA. Pt with improved RUE/LE activation and plan to trial standing at RW next session, may need RUE grip assist or ace wrap due to weak R grasp and pt still needing R knee block due to buckling, may benefit from R KI for gait progression (only to don during PT session) until buckling improves. Pt continues to benefit from PT services to progress toward functional mobility goals.   Recommendations for follow up therapy are one component of a multi-disciplinary discharge planning process, led by the attending physician.  Recommendations may be updated based on patient status, additional functional criteria and insurance authorization.  Follow Up Recommendations  CIR     Equipment Recommendations  Other (comment) (TBD)    Recommendations for Other Services Rehab consult     Precautions / Restrictions Precautions Precautions: Fall Restrictions Weight Bearing Restrictions: No     Mobility  Bed Mobility Overal bed mobility: Needs Assistance Bed Mobility: Supine to Sit     Supine to sit: Min assist     General bed  mobility comments: Needed assist with RLE, pt with mild inattention to RLE with mobility/transfers    Transfers Overall transfer level: Needs assistance Equipment used: Hemi-walker Transfers: Sit to/from [employer];Lateral/Scoot Transfers Sit to Stand: Mod assist;+2 physical assistance;+2 safety/equipment   Squat pivot transfers: Min guard;+2 safety/equipment    Lateral/Scoot Transfers: Min guard General transfer comment: Pt able to transfer to chair with lateral scoots and a squat pivot relying on his LLE and good LUE placement, needs some manual assist for RUE/LE placement and cues for body awareness  Ambulation/Gait Ambulation/Gait assistance: Mod assist;+2 physical assistance Gait Distance (Feet):  (pre-gait hip flexion on RLE only x5 reps, quick to fatigue, RLE buckling so unable to deweight LLE) Assistive device: Hemi-walker       General Gait Details: pre-gait standing only, unable to step with LLE due to RLE buckling   Stairs             Wheelchair Mobility    Modified Rankin (Stroke Patients Only) Modified Rankin (Stroke Patients Only) Pre-Morbid Rankin Score: No symptoms Modified Rankin: Severe disability     Balance Overall balance assessment: Needs assistance Sitting-balance support: Single extremity supported;Feet supported Sitting balance-Leahy Scale: Fair     Standing balance support: Single extremity supported;No upper extremity supported Standing balance-Leahy Scale: Poor Standing balance comment: up to +2 modA for dynamic standing, good control with LUE support for squat transfers needing min guard for safety  Cognition Arousal/Alertness:  (initially sleeping, more alert once seated EOB) Behavior During Therapy: Flat affect Overall Cognitive Status: Impaired/Different from baseline Area of Impairment: Safety/judgement;Problem solving                         Safety/Judgement:  Decreased awareness of safety;Decreased awareness of deficits   Problem Solving: Slow processing;Requires verbal cues;Requires tactile cues General Comments: Pt reports extreme fatigue and perseverating on lack of sleep due to 20 year history of working night shift, but able to be redirected for functional tasks with heavy encouragement. Pt benefits from positive reinforcement on his progress from previous session and more agreeable after transfer to chair.      Exercises General Exercises - Lower Extremity Ankle Circles/Pumps: AROM;Left;10 reps;Supine Long Arc Quad: AAROM;Right;10 reps;Seated Hip Flexion/Marching: AAROM;Right;10 reps;Seated Hand Exercises Wrist Extension: AAROM;Right;5 reps Digit Composite Flexion: AAROM;Right;10 reps Composite Extension: AAROM;10 reps;Right Opposition: AAROM;Right;5 reps Other Exercises Other Exercises: STS x 3 total reps for BLE strengthening Other Exercises: standing RLE hip flexion x5 reps x2 trials Other Exercises: RUE AROM: elbow flexion x10 reps standing at L HW    General Comments General comments (skin integrity, edema, etc.): IV site on LUE red and swollen, RN notified and removed IV during session      Pertinent Vitals/Pain Pain Assessment: Faces Faces Pain Scale: Hurts a little bit Pain Location: generalized discomfort Pain Descriptors / Indicators: Discomfort Pain Intervention(s): Monitored during session;Limited activity within patient's tolerance;Repositioned    Home Living                      Prior Function            PT Goals (current goals can now be found in the care plan section) Acute Rehab PT Goals Patient Stated Goal: to return home walking PT Goal Formulation: With patient Time For Goal Achievement: 06/22/21 Progress towards PT goals: Progressing toward goals    Frequency    Min 4X/week      PT Plan Current plan remains appropriate    Co-evaluation PT/OT/SLP Co-Evaluation/Treatment:  Yes Reason for Co-Treatment: Complexity of the patient's impairments (multi-system involvement);Necessary to address cognition/behavior during functional activity;For patient/therapist safety;To address functional/ADL transfers PT goals addressed during session: Mobility/safety with mobility;Balance;Proper use of DME;Strengthening/ROM OT goals addressed during session: ADL's and self-care;Strengthening/ROM;Proper use of Adaptive equipment and DME      AM-PAC PT "6 Clicks" Mobility   Outcome Measure  Help needed turning from your back to your side while in a flat bed without using bedrails?: A Little Help needed moving from lying on your back to sitting on the side of a flat bed without using bedrails?: A Little Help needed moving to and from a bed to a chair (including a wheelchair)?: A Little Help needed standing up from a chair using your arms (e.g., wheelchair or bedside chair)?: A Lot Help needed to walk in hospital room?: Total Help needed climbing 3-5 steps with a railing? : Total 6 Click Score: 13    End of Session Equipment Utilized During Treatment: Gait belt Activity Tolerance: Patient tolerated treatment well Patient left: in chair;with call bell/phone within reach;with chair alarm set Nurse Communication: Mobility status;Other (comment) (pt does well scooting toward strong L side, turn his chair around for safety with scooting back to bed from drop arm recliner.) PT Visit Diagnosis: Unsteadiness on feet (R26.81);Muscle weakness (generalized) (M62.81);Other abnormalities of gait and mobility (R26.89);Hemiplegia and hemiparesis Hemiplegia - Right/Left: Right Hemiplegia -  dominant/non-dominant: Dominant Hemiplegia - caused by: Cerebral infarction     Time: 7414-2395 PT Time Calculation (min) (ACUTE ONLY): 36 min  Charges:  $Therapeutic Exercise: 8-22 mins                     Wally Behan P., PTA Acute Rehabilitation Services Pager: [phone] Office: [phone]    Dorathy Kinsman Amadea Keagy 06/09/2021, 2:10 PM

## 2021-06-09 NOTE — Progress Notes (Signed)
Speech Language Pathology Treatment: Cognitive-Linquistic  Patient Details Name: Christian Russell MRN: [id number] DOB: 05-03-1971 Today's Date: 06/09/2021 Time: 1405-1430 SLP Time Calculation (min) (ACUTE ONLY): 25 min  Assessment / Plan / Recommendation Clinical Impression  Pt agreeable to tx session this date as he works third shift typically and afternoons are preferred for ST sessions.  Ongoing cognitive assessment completed with deficits in areas of sustained attention requiring more time to process and complete tasks with min visual/verbal cues required during tasks.  He stated "I can't get the words out sometimes" during session and struggled during simple deductive naming tasks with 8 items named within a minute for simple category.  Confrontational naming with increased accuracy for simple environmental items with 95% achieved given min cues.  Recalled 5/5 words after a time delay without cueing provided.  Pt achieved 75% accuracy with paragraph retention task and was able to complete a simple calculation task, but experienced challenges with recalling >2 digit number backwards.  Dysarthria improved at sentence to conversation level with intelligibility judged to be 75-100% accurate.  Provided pt with compensatory strategies for anomia.  ST will continue efforts within acute setting.  HPI HPI: 50 y.o. 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      SLP Plan  Continue with current plan of care      Recommendations for follow up therapy are one component of a multi-disciplinary discharge planning process, led by the attending physician.  Recommendations may be updated based on patient status, additional functional criteria and insurance authorization.    Recommendations                   General recommendations: Other(comment) (TBD) Follow up Recommendations: Other (comment) (TBD) SLP Visit Diagnosis:  Dysarthria and anarthria (R47.1);Cognitive communication deficit (S06.301) Plan: Continue with current plan of care                       Tressie Stalker, M.S., CCC-SLP  06/09/2021, 3:37 PM

## 2021-06-09 NOTE — Progress Notes (Signed)
PROGRESS NOTE    Christian Russell  MBW:[id number] DOB: 09-04-1970 DOA: 06/06/2021 PCP: Patient, No Pcp Per (Inactive)  Brief Narrative: 49/M with history of depression presented to the ED on 10/10 with acute onset right-sided weakness and dysarthria, CT head at [facility] noted left basal ganglia intracranial hemorrhage, he was started on Cleviprex for blood pressure control and transferred to [facility], Admitted to neuro ICU. -CTA brain was negative for aneurysm or AVM -transferred to [facility] service by neurology today 10/13   Assessment & Plan:   Left basal ganglia hemorrhagic stroke -With resultant dense right hemiplegia and dysarthria -Suspected to be related to uncontrolled hypertension -CTA head and neck negative for aneurysm or AVM -Repeat MRI brain noted stable left basal ganglia hemorrhage measuring 3.9 X2.1X 1.4 cm with mild surrounding edema, no intraventricular extension -2D echo noted preserved EF, LDL is 111 now on statin -Hemoglobin A1c is 5.4 -PT OT, SLP following -CIR recommended for rehabilitation  Hypertension -BP stable on current regimen of amlodipine and lisinopril  Dyslipidemia -LDL 111, now on statin  THC positive -Counseled against this due to stroke risk  DVT prophylaxis: SCDs Code Status: Full code Family Communication: Discussed patient in detail, no family at bedside, will update mother Disposition Plan:  Status is: Inpatient  Remains inpatient appropriate because:Inpatient level of care appropriate due to severity of illness  Dispo: The patient is from: Home              Anticipated d/c is to: CIR              Patient currently is not medically stable to d/c.   Difficult to place patient No        Consultants:  Neurology  Procedures:   Antimicrobials:    Subjective: -Feels okay overall, has dense right-sided deficits  Objective: Vitals:   06/08/21 2142 06/08/21 2308 06/09/21 0348 06/09/21 0845  BP: 121/82 105/70 (!) 118/99  127/83  Pulse: (!) 107 95 92 97  Resp: 18 19 18 18   Temp: 98 F (36.7 C) 98.1 F (36.7 C) 98.1 F (36.7 C) 98.3 F (36.8 C)  TempSrc: Oral Oral Oral Oral  SpO2: 97% 97% 98% 100%  Height:        Intake/Output Summary (Last 24 hours) at 06/09/2021 1111 Last data filed at 06/08/2021 2222 Gross per 24 hour  Intake 200 ml  Output --  Net 200 ml   There were no vitals filed for this visit.  Examination: , General exam: Calm pleasant male laying in bed, awake alert, oriented to self and place, mild delay in processing information CVS: S1-S2, regular rate rhythm Lungs: Clear bilaterally Abdomen: Soft, nontender, bowel sounds present Extremities: No edema Neuro: Dense right hemiplegia, mild dysarthria Psychiatry:  Mood & affect appropriate.     Data Reviewed:   CBC: Recent Labs  Lab 06/06/21 2233 06/06/21 2234  WBC 8.2  --   NEUTROABS 5.6  --   HGB 15.0 15.3  HCT 44.8 45.0  MCV 103.5*  --   PLT 313  --    Basic Metabolic Panel: Recent Labs  Lab 06/06/21 2234  NA 136  140  K 4.1  4.4  CL 103  103  CO2 26  GLUCOSE 118*  118*  BUN 11  10  CREATININE 0.84  0.80  CALCIUM 8.7*   GFR: CrCl cannot be calculated (Unknown ideal weight.). Liver Function Tests: Recent Labs  Lab 06/06/21 2234  AST 28  ALT 27  ALKPHOS  76  BILITOT 0.6  PROT 7.5  ALBUMIN 4.3   No results for input(s): LIPASE, AMYLASE in the last 168 hours. No results for input(s): AMMONIA in the last 168 hours. Coagulation Profile: Recent Labs  Lab 06/06/21 2234  INR 1.0   Cardiac Enzymes: No results for input(s): CKTOTAL, CKMB, CKMBINDEX, TROPONINI in the last 168 hours. BNP (last 3 results) No results for input(s): PROBNP in the last 8760 hours. HbA1C: Recent Labs    06/07/21 0328  HGBA1C 5.4   CBG: Recent Labs  Lab 06/06/21 2227  GLUCAP 106*   Lipid Profile: Recent Labs    06/07/21 0328  CHOL 205*  HDL 63  LDLCALC 111*  TRIG 153*  CHOLHDL 3.3   Thyroid  Function Tests: No results for input(s): TSH, T4TOTAL, FREET4, T3FREE, THYROIDAB in the last 72 hours. Anemia Panel: No results for input(s): VITAMINB12, FOLATE, FERRITIN, TIBC, IRON, RETICCTPCT in the last 72 hours. Urine analysis:    Component Value Date/Time   COLORURINE YELLOW 06/06/2021 2251   APPEARANCEUR CLEAR 06/06/2021 2251   LABSPEC 1.019 06/06/2021 2251   PHURINE 5.0 06/06/2021 2251   GLUCOSEU NEGATIVE 06/06/2021 2251   HGBUR NEGATIVE 06/06/2021 2251   BILIRUBINUR NEGATIVE 06/06/2021 2251   KETONESUR NEGATIVE 06/06/2021 2251   PROTEINUR NEGATIVE 06/06/2021 2251   NITRITE NEGATIVE 06/06/2021 2251   LEUKOCYTESUR NEGATIVE 06/06/2021 2251   Sepsis Labs: @LABRCNTIP (procalcitonin:4,lacticidven:4)  ) Recent Results (from the past 240 hour(s))  Resp Panel by RT-PCR (Flu A&B, Covid) Nasopharyngeal Swab     Status: None   Collection Time: 06/06/21 10:33 PM   Specimen: Nasopharyngeal Swab; Nasopharyngeal(NP) swabs in vial transport medium  Result Value Ref Range Status   SARS Coronavirus 2 by RT PCR NEGATIVE NEGATIVE Final    Comment: (NOTE) SARS-CoV-2 target nucleic acids are NOT DETECTED.  The SARS-CoV-2 RNA is generally detectable in upper respiratory specimens during the acute phase of infection. The lowest concentration of SARS-CoV-2 viral copies this assay can detect is 138 copies/mL. A negative result does not preclude SARS-Cov-2 infection and should not be used as the sole basis for treatment or other patient management decisions. A negative result may occur with  improper specimen collection/handling, submission of specimen other than nasopharyngeal swab, presence of viral mutation(s) within the areas targeted by this assay, and inadequate number of viral copies(<138 copies/mL). A negative result must be combined with clinical observations, patient history, and epidemiological information. The expected result is Negative.  Fact Sheet for Patients:   BloggerCourse.com  Fact Sheet for Healthcare Providers:  SeriousBroker.it  This test is no t yet approved or cleared by the [place] FDA and  has been authorized for detection and/or diagnosis of SARS-CoV-2 by FDA under an Emergency Use Authorization (EUA). This EUA will remain  in effect (meaning this test can be used) for the duration of the COVID-19 declaration under Section 564(b)(1) of the Act, 21 U.S.C.section 360bbb-3(b)(1), unless the authorization is terminated  or revoked sooner.       Influenza A by PCR NEGATIVE NEGATIVE Final   Influenza B by PCR NEGATIVE NEGATIVE Final    Comment: (NOTE) The Xpert Xpress SARS-CoV-2/FLU/RSV plus assay is intended as an aid in the diagnosis of influenza from Nasopharyngeal swab specimens and should not be used as a sole basis for treatment. Nasal washings and aspirates are unacceptable for Xpert Xpress SARS-CoV-2/FLU/RSV testing.  Fact Sheet for Patients: BloggerCourse.com  Fact Sheet for Healthcare Providers: SeriousBroker.it  This test is not yet approved or cleared  by the [place] and has been authorized for detection and/or diagnosis of SARS-CoV-2 by FDA under an Emergency Use Authorization (EUA). This EUA will remain in effect (meaning this test can be used) for the duration of the COVID-19 declaration under Section 564(b)(1) of the Act, 21 U.S.C. section 360bbb-3(b)(1), unless the authorization is terminated or revoked.  Performed at [facility], [address], [place], [place] [address]   MRSA Next Gen by PCR, Nasal     Status: None   Collection Time: 06/07/21  1:33 AM   Specimen: Nasal Mucosa; Nasal Swab  Result Value Ref Range Status   MRSA by PCR Next Gen NOT DETECTED NOT DETECTED Final    Comment: (NOTE) The GeneXpert MRSA Assay (FDA approved for NASAL specimens only), is one component of a  comprehensive MRSA colonization surveillance program. It is not intended to diagnose MRSA infection nor to guide or monitor treatment for MRSA infections. Test performance is not FDA approved in patients less than 29 years old. Performed at [facility], [address] [place], [place] [address]          Radiology Studies: MR BRAIN WO CONTRAST  Result Date: 06/07/2021 CLINICAL DATA:  Concern for brain mass or lesion, intracranial hemorrhage EXAM: MRI HEAD WITHOUT CONTRAST TECHNIQUE: Multiplanar, multiecho pulse sequences of the brain and surrounding structures were obtained without intravenous contrast. COMPARISON:  06/12/2016. Correlation is also made with CT head 06/07/2021. FINDINGS: Evaluation is somewhat limited by the absence of intravenous contrast, as the patient could not tolerate continuing the exam through the contrasted portions. Brain: Redemonstrated area of intraparenchymal hemorrhage in the left middle cerebral peduncle, thalamus, internal capsule, and corona radiata, measuring approximately 2.1 x 1.4 x 3.9 cm (series 6, image 15 and series 5, image 17). This area is associated with peripheral restricted diffusion, likely infarct, and surrounding increased T2 signal, consistent with edema, which extends into the left midbrain. Additional foci of susceptibility in the posterior left temporal lobe (series 7, image 10, unchanged from 2017), posterior right lentiform nucleus (series 7, image 11), and in the left occipital lobe (series 7, image 12). No evidence of intraventricular hemorrhage, significant mass effect, or midline shift. No hydrocephalus or extra-axial collection. No definite underlying mass lesion is seen. Vascular: Normal flow voids. Skull and upper cervical spine: Normal marrow signal. 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 Status post left maxillary antrostomy. The orbits are unremarkable. Other: Fluid and  left-greater-than-right mastoid air cells. IMPRESSION: Evaluation is somewhat limited by the absence of intravenous contrast, as the patient could not tolerate continuing the exam through the contrasted portions. Within this limitation, no underlying lesion is seen in association with left thalamic, internal capsule, and corona radiata hemorrhage. Electronically Signed   By: Wiliam Ke M.D.   On: 06/07/2021 17:25   ECHOCARDIOGRAM COMPLETE  Result Date: 06/07/2021    ECHOCARDIOGRAM REPORT   Patient Name:   JAKEL ALPHIN Espana Date of Exam: 06/07/2021 Medical Rec #:  [id number]   Height:       64.0 in Accession #:    [id number]  Weight:       156.0 lb Date of Birth:  Dec 29, 1970  BSA:          1.760 m Patient Age:    49 years    BP:           129/101 mmHg Patient Gender: M  HR:           92 bpm. Exam Location:  Inpatient Procedure: 2D Echo, Cardiac Doppler and Color Doppler Indications:    Stroke I63.9  History:        Patient has no prior history of Echocardiogram examinations.  Sonographer:    Roosvelt Maser RDCS Referring Phys: [id number] [facility] IMPRESSIONS  1. Left ventricular ejection fraction, by estimation, is 60 to 65%. The left ventricle has normal function. The left ventricle has no regional wall motion abnormalities. Left ventricular diastolic parameters are consistent with Grade I diastolic dysfunction (impaired relaxation).  2. Right ventricular systolic function is hyperdynamic. The right ventricular size is normal. Tricuspid regurgitation signal is inadequate for assessing PA pressure.  3. Right atrial size was mildly dilated.  4. The mitral valve is normal in structure. No evidence of mitral valve regurgitation. No evidence of mitral stenosis.  5. The aortic valve is tricuspid. There is mild calcification of the aortic valve. Aortic valve regurgitation is not visualized. Mild aortic valve sclerosis is present, with no evidence of aortic valve stenosis. Comparison(s): No prior  Echocardiogram. FINDINGS  Left Ventricle: Left ventricular ejection fraction, by estimation, is 60 to 65%. The left ventricle has normal function. The left ventricle has no regional wall motion abnormalities. The left ventricular internal cavity size was small. There is no left ventricular hypertrophy. Left ventricular diastolic parameters are consistent with Grade I diastolic dysfunction (impaired relaxation). Right Ventricle: The right ventricular size is normal. No increase in right ventricular wall thickness. Right ventricular systolic function is hyperdynamic. Tricuspid regurgitation signal is inadequate for assessing PA pressure. Left Atrium: Left atrial size was normal in size. Right Atrium: Right atrial size was mildly dilated. Pericardium: There is no evidence of pericardial effusion. Mitral Valve: The mitral valve is normal in structure. No evidence of mitral valve regurgitation. No evidence of mitral valve stenosis. Tricuspid Valve: The tricuspid valve is normal in structure. Tricuspid valve regurgitation is not demonstrated. No evidence of tricuspid stenosis. Aortic Valve: The aortic valve is tricuspid. There is mild calcification of the aortic valve. Aortic valve regurgitation is not visualized. Mild aortic valve sclerosis is present, with no evidence of aortic valve stenosis. Aortic valve mean gradient measures 3.0 mmHg. Aortic valve peak gradient measures 5.7 mmHg. Aortic valve area, by VTI measures 3.38 cm. Pulmonic Valve: The pulmonic valve was normal in structure. Pulmonic valve regurgitation is not visualized. No evidence of pulmonic stenosis. Aorta: The aortic root and ascending aorta are structurally normal, with no evidence of dilitation. IAS/Shunts: The atrial septum is grossly normal.  LEFT VENTRICLE PLAX 2D LVIDd:         3.90 cm   Diastology LVIDs:         2.60 cm   LV e' medial:    3.70 cm/s LV PW:         1.00 cm   LV E/e' medial:  14.7 LV IVS:        1.00 cm   LV e' lateral:   10.90  cm/s LVOT diam:     2.30 cm   LV E/e' lateral: 5.0 LV SV:         63 LV SV Index:   36 LVOT Area:     4.15 cm  RIGHT VENTRICLE RV Basal diam:  3.10 cm LEFT ATRIUM             Index        RIGHT ATRIUM  Index LA diam:        3.50 cm 1.99 cm/m   RA Area:     19.80 cm LA Vol (A2C):   44.7 ml 25.39 ml/m  RA Volume:   57.60 ml  32.72 ml/m LA Vol (A4C):   36.6 ml 20.79 ml/m LA Biplane Vol: 44.3 ml 25.17 ml/m  AORTIC VALVE AV Area (Vmax):    3.21 cm AV Area (Vmean):   3.07 cm AV Area (VTI):     3.38 cm AV Vmax:           119.00 cm/s AV Vmean:          82.800 cm/s AV VTI:            0.187 m AV Peak Grad:      5.7 mmHg AV Mean Grad:      3.0 mmHg LVOT Vmax:         92.00 cm/s LVOT Vmean:        61.100 cm/s LVOT VTI:          0.152 m LVOT/AV VTI ratio: 0.81  AORTA Ao Root diam: 3.50 cm Ao Asc diam:  3.10 cm MITRAL VALVE MV Area (PHT): 2.94 cm    SHUNTS MV Decel Time: 258 msec    Systemic VTI:  0.15 m MV E velocity: 54.40 cm/s  Systemic Diam: 2.30 cm MV A velocity: 70.30 cm/s MV E/A ratio:  0.77 Riley Lam MD Electronically signed by Riley Lam MD Signature Date/Time: 06/07/2021/3:42:35 PM    Final         Scheduled Meds:  amLODipine  5 mg Oral Daily   atorvastatin  80 mg Oral Daily   lisinopril  5 mg Oral Daily   melatonin  3 mg Oral QHS   pantoprazole  40 mg Oral QHS   senna-docusate  1 tablet Oral BID   Continuous Infusions:   LOS: 3 days    Time spent:    Zannie Cove, MD [facility]   06/09/2021, 11:11 AM

## 2021-06-09 NOTE — Progress Notes (Signed)
STROKE TEAM PROGRESS NOTE   INTERVAL HISTORY No one is at the bedside at time of exam. Complains of persistent right sided weakness and dysarthria .  Vital signs stable.  Blood pressure adequately controlled.   MRI scan of the brain shows stable appearance of left basal ganglia hemorrhage but was limited as patient not able to cooperate to complete the study due to postcontrast imaging.  Patient has been seen by therapies who recommended inpatient rehab.  Neurological exam is unchanged Vitals:   06/08/21 2142 06/08/21 2308 06/09/21 0348 06/09/21 0845  BP: 121/82 105/70 (!) 118/99 127/83  Pulse: (!) 107 95 92 97  Resp: 18 19 18 18   Temp: 98 F (36.7 C) 98.1 F (36.7 C) 98.1 F (36.7 C) 98.3 F (36.8 C)  TempSrc: Oral Oral Oral Oral  SpO2: 97% 97% 98% 100%  Height:       CBC:  Recent Labs  Lab 06/06/21 2233 06/06/21 2234  WBC 8.2  --   NEUTROABS 5.6  --   HGB 15.0 15.3  HCT 44.8 45.0  MCV 103.5*  --   PLT 313  --    Basic Metabolic Panel:  Recent Labs  Lab 06/06/21 2234  NA 136  140  K 4.1  4.4  CL 103  103  CO2 26  GLUCOSE 118*  118*  BUN 11  10  CREATININE 0.84  0.80  CALCIUM 8.7*    Lipid Panel:  Recent Labs  Lab 06/07/21 0328  CHOL 205*  TRIG 153*  HDL 63  CHOLHDL 3.3  VLDL 31  LDLCALC 08/07/21*    HgbA1c:  Recent Labs  Lab 06/07/21 0328  HGBA1C 5.4   Urine Drug Screen:  Recent Labs  Lab 06/06/21 2251  LABOPIA POSITIVE*  COCAINSCRNUR NONE DETECTED  LABBENZ NONE DETECTED  AMPHETMU NONE DETECTED  THCU POSITIVE*  LABBARB NONE DETECTED    Alcohol Level  Recent Labs  Lab 06/06/21 2233  ETH <10    IMAGING past 24 hours No results found.  PHYSICAL EXAM  General: Pleasant middle-aged Caucasian male laying comfortably in bed; in no acute distress.  HENT: Normal oropharynx and mucosa. Normal external appearance of ears and nose.  Neck: Supple, no pain or tenderness  CV: No JVD. No peripheral edema.  Pulmonary: Symmetric Chest rise. Normal  respiratory effort.  Abdomen: Soft to touch, non-tender.  Ext: No cyanosis, edema, or deformity  Skin: No rash. Normal palpation of skin.   Musculoskeletal: Normal digits and nails by inspection. No clubbing.    Neurologic Examination  Mental status/Cognition: Alert, oriented to self, place, month and year, good attention.  Speech/language: dysarthric speech, fluent, comprehension intact, object naming intact, repetition intact.  Cranial nerves:   CN II Pupils equal and reactive to light, no VF deficits    CN III,IV,VI EOM intact, no gaze preference or deviation, no nystagmus    CN V normal sensation in V1, V2, and V3 segments bilaterally    CN VII R facial droop   CN VIII normal hearing to speech    CN IX & X normal palatal elevation, no uvular deviation    CN XI 5/5 head turn and 5/5 shoulder shrug bilaterally    CN XII midline tongue protrusion    Coordination/Complex Motor:  Right hemiparesis with grade 1/5 right upper extremity strength proximally and 0/5 distally.  Right lower extremity strength is 2-3/5 proximally and distally. - Finger to Nose intact on the left - Heel to shin unable to do 2/2  weakness. - Rapid alternating movement are intact on the left. - Gait: unable and unsafe to assess with profound R sided weakness.  ASSESSMENT/PLAN [name] is a 50 y.o. male with no significant medical history who presents with R sided weakness and dysarthria. He initially presented to [facility] ED where a STAT CTH w/o contrast demonstrated left basal ganglia ICH. He was started on Cleviprex for blood pressure control and transferred for neurology evaluation and ICU admission at [facility]. MRI brain is pending.   Stroke , left basal ganglia hemorrhagic: Due to uncontrolled hypertension CT head: left basal ganglia and internal capsule hemorrhage  CTA head & neck:  Normal CTA of the head and neck. No aneurysm or vascular malformation. The intraparenchymal hematoma is most  consistent with hypertensive hemorrhage. MRI  brain stable appearance of the 2.1 x 1.4 x 3.9 cm left basal ganglia hemorrhage extending into thalamus, and left middle cerebral peduncle with mild surrounding edema but no intraventricular extension.  2D Echo left ventricular ejection fraction 60 to 65%. LDL 111 HgbA1c 5.4 VTE prophylaxis - scd     Diet   Diet regular Room service appropriate? Yes with Assist; Fluid consistency: Thin   No antithrombotic prior to admission, now on No antithrombotic. hemorrhage Therapy recommendations:   CLR  disposition:  pending  Hypertension Home meds:  none Stable Permissive hypertension (OK if < 220/120) but gradually normalize in 5-7 days Long-term BP goal normotensive  Hyperlipidemia Home meds:  none LDL 111, goal < 70 Add lipitor   High intensity statin   Continue statin at discharge   HgbA1c 5.4, goal < 7.0 CBGs Recent Labs    06/06/21 2227  GLUCAP 106*    SSI  Other Stroke Risk Factors    Substance abuse - UDS:  THC POSITIVE, Cocaine NONE DETECTED. Patient advised to stop using due to stroke risk.   Hospital day # 3  Continue ongoing therapies.  Inpatient rehab consult awaited.  Hopefully transfer to rehab in the next few days when bed available.  Discussed with patient and with his mother over the phone and answered questions.  Discussed with Dr. Jomarie Longs.  Greater than 50% time during this 25-minute visit was spent on counseling and coordination of care and discussion with care team about his stroke and need for rehab.     Delia Heady, MD Medical Director [facility] Pager: [phone] 06/09/2021 1:49 PM   To contact Stroke Continuity provider, please refer to [email or web address]. After hours, contact General Neurology

## 2021-06-09 NOTE — TOC CAGE-AID Note (Signed)
Transition of Care [facility]) - CAGE-AID Screening   Patient Details  Name: Christian Russell MRN: [id number] Date of Birth: 18-May-1971  Transition of Care [facility]) CM/SW Contact:    Dulce Martian C Tarpley-Carter, LCSWA Phone Number: 06/09/2021, 3:04 PM   Clinical Narrative: Pt participated in Cage-Aid.  Pt stated he does not use substance or ETOH.  Pt was not offered resources, due to no usage of substance or ETOH.     Samanthamarie Ezzell Tarpley-Carter, MSW, LCSW-A Pronouns:  She/Her/Hers [facility] HealthTransitions of Care Clinical Social Worker Direct Number:  [phone] [email or web address]    CAGE-AID Screening:    Have You Ever Felt You Ought to Cut Down on Your Drinking or Drug Use?: No Have People Annoyed You By [employer] Your Drinking Or Drug Use?: No Have You Felt Bad Or Guilty About Your Drinking Or Drug Use?: No Have You Ever Had a Drink or Used Drugs First Thing In The Morning to Steady Your Nerves or to Get Rid of a Hangover?: No CAGE-AID Score: 0  Substance Abuse Education Offered: No

## 2021-06-09 NOTE — Progress Notes (Signed)
IP rehab admissions - I met with patient at the bedside.  He is interested in CIR.  He lives with his 52 yo mother who is in fairly good shape per patient.  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  He works nights so he is having trouble sleeping.  I will contact [employer] and begin precert process and will request acute inpatient rehab admission.  My partner will follow up tomorrow.  Call for questions.  [phone]

## 2021-06-09 NOTE — Progress Notes (Signed)
Occupational Therapy Treatment Patient Details Name: Christian Russell MRN: [id number] DOB: February 20, 1971 Today's Date: 06/09/2021   History of present illness 50 y.o. 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 PMH none   OT comments  Pt making good progress with OT goals this session. Pt started the session very lethargic, requiring increased verbal encouragement to participate. As session progressed and pt was able to move his RUE and RLE more, he became more participatory. He was able to move his RLE a step this session and produce Goettl movements with his RUE with no assist. Pt was provided exercises to assist with encouraging further movement for both RUE and RLE. OT will continue to follow acutely.    Recommendations for follow up therapy are one component of a multi-disciplinary discharge planning process, led by the attending physician.  Recommendations may be updated based on patient status, additional functional criteria and insurance authorization.    Follow Up Recommendations  CIR    Equipment Recommendations  3 in 1 bedside commode    Recommendations for Other Services Rehab consult    Precautions / Restrictions Precautions Precautions: Fall Restrictions Weight Bearing Restrictions: No       Mobility Bed Mobility Overal bed mobility: Needs Assistance Bed Mobility: Supine to Sit     Supine to sit: Min assist     General bed mobility comments: Needed assist with RLE, pt with mild inattention to RLE with mobility/transfers    Transfers Overall transfer level: Needs assistance Equipment used: Hemi-walker Transfers: Sit to/from [employer];Lateral/Scoot Transfers Sit to Stand: Mod assist;+2 physical assistance;+2 safety/equipment   Squat pivot transfers: Min guard;+2 safety/equipment    Lateral/Scoot Transfers: Min guard General transfer comment: Pt able to transfer to  chair with lateral scoots and a squat pivot relying on his LLE    Balance Overall balance assessment: Needs assistance Sitting-balance support: Single extremity supported;Feet supported Sitting balance-Leahy Scale: Fair     Standing balance support: Single extremity supported;No upper extremity supported Standing balance-Leahy Scale: Poor                             ADL either performed or assessed with clinical judgement   ADL Overall ADL's : Needs assistance/impaired Eating/Feeding: Set up;Sitting Eating/Feeding Details (indicate cue type and reason): Pt able to cut his meat and feed himself with his L hand, required some set up to move things to his L side for easier use. Grooming: Wash/dry hands;Wash/dry face;Minimal assistance;Sitting Grooming Details (indicate cue type and reason): Min A to help clean his R hand         Upper Body Dressing : Minimal assistance;Sitting Upper Body Dressing Details (indicate cue type and reason): Pt needed minimal A to place his R hand into hospital gown sleeve, cuing to use his L arm to assist.     Toilet Transfer: Minimal assistance;+2 for physical assistance;+2 for safety/equipment;Stand-pivot Toilet Transfer Details (indicate cue type and reason): Min A for safety and to fully bring hips over         Functional mobility during ADLs: Minimal assistance (W/ hemiwalker) General ADL Comments: Pt moving better this session requiring min A and verbal cueing to assist his R side with his L.     Vision   Additional Comments: Functional vision intact   Perception     Praxis      Cognition  Arousal/Alertness: Lethargic Behavior During Therapy: Flat affect;Impulsive Overall Cognitive Status: Impaired/Different from baseline Area of Impairment: Safety/judgement;Problem solving                         Safety/Judgement: Decreased awareness of safety;Decreased awareness of deficits   Problem Solving: Decreased  initiation;Slow processing General Comments: Pt reports extreme fatigue and focused on that throuhgout the session. Difficulty maintaining focus on tasks, requiring verbal cuing and increased encouragment.        Exercises Exercises: Hand exercises Hand Exercises Wrist Extension: AAROM;Right;5 reps Digit Composite Flexion: AAROM;Right;10 reps Composite Extension: AAROM;10 reps;Right Opposition: AAROM;Right;5 reps   Shoulder Instructions       General Comments IV site on LUE red and swollen, RN notified and removed IV during session    Pertinent Vitals/ Pain       Pain Assessment: Faces Faces Pain Scale: Hurts little more Pain Location: pain all over / generalized Pain Descriptors / Indicators: Discomfort Pain Intervention(s): Monitored during session;Repositioned  Home Living                                          Prior Functioning/Environment              Frequency  Min 2X/week        Progress Toward Goals  OT Goals(current goals can now be found in the care plan section)  Progress towards OT goals: Progressing toward goals  Acute Rehab OT Goals Patient Stated Goal: to return home walking OT Goal Formulation: With patient Time For Goal Achievement: 06/22/21 Potential to Achieve Goals: Good ADL Goals Pt Will Perform Grooming: sitting;with set-up Pt Will Perform Upper Body Bathing: with min guard assist;sitting Pt Will Transfer to Toilet: with min assist;stand pivot transfer Additional ADL Goal #1: Pt will complete bed mobility min (A) as precursor to adls  Plan Discharge plan remains appropriate;Frequency remains appropriate    Co-evaluation    PT/OT/SLP Co-Evaluation/Treatment: Yes Reason for Co-Treatment: Complexity of the patient's impairments (multi-system involvement);For patient/therapist safety;To address functional/ADL transfers   OT goals addressed during session: ADL's and self-care;Strengthening/ROM;Proper use of Adaptive  equipment and DME      AM-PAC OT "6 Clicks" Daily Activity     Outcome Measure   Help from another person eating meals?: A Little Help from another person taking care of personal grooming?: A Little Help from another person toileting, which includes using toliet, bedpan, or urinal?: A Lot Help from another person bathing (including washing, rinsing, drying)?: A Lot Help from another person to put on and taking off regular upper body clothing?: A Little Help from another person to put on and taking off regular lower body clothing?: A Lot 6 Click Score: 15    End of Session Equipment Utilized During Treatment: Gait belt;Other (comment) (Hemiwalker)  OT Visit Diagnosis: Unsteadiness on feet (R26.81);Muscle weakness (generalized) (M62.81);Hemiplegia and hemiparesis Hemiplegia - Right/Left: Right Hemiplegia - dominant/non-dominant: Dominant Hemiplegia - caused by: Cerebral infarction   Activity Tolerance Patient tolerated treatment well   Patient Left in chair;with call bell/phone within reach;with chair alarm set   Nurse Communication Mobility status (IV site red)        Time: 2505-3976 OT Time Calculation (min): 36 min  Charges: OT General Charges $OT Visit: 1 Visit OT Treatments $Therapeutic Activity: 8-22 mins  Tsuneo Faison H., OTR/L Acute Rehabilitation  Andrianna Manalang Elane Bing Plume 06/09/2021,  1:21 PM

## 2021-06-09 NOTE — Progress Notes (Signed)
Physical Therapy Treatment Patient Details Name: Christian Russell MRN: [id number] DOB: 05/06/1971 Today's Date: 06/09/2021   History of Present Illness 50 y.o. 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 PMH: Depression, HLD.    PT Comments    PTA called back into room per pt/family request to assist RN with safe transfer to [facility] from EOB. Pt impulsive due to bowel urgency and needs mod cues for safety/assistive device use and R knee block due to buckling. Pt performed stand pivot with RW and +2 modA. Recommended Stedy for safe return transfer to bed once pt done toileting due to RLE buckling and fatigue.    Recommendations for follow up therapy are one component of a multi-disciplinary discharge planning process, led by the attending physician.  Recommendations may be updated based on patient status, additional functional criteria and insurance authorization.  Follow Up Recommendations  CIR     Equipment Recommendations  Other (comment) (TBD)    Recommendations for Other Services Rehab consult     Precautions / Restrictions Precautions Precautions: Fall Precaution Comments: BP goal <220/120 Restrictions Weight Bearing Restrictions: No     Mobility  Bed Mobility Overal bed mobility: Needs Assistance Bed Mobility: Supine to Sit     Supine to sit: Min assist     General bed mobility comments: to R EOB, good initiation minA for safety and RLE assist    Transfers Overall transfer level: Needs assistance Equipment used: Rolling walker (2 wheeled) Transfers: Sit to/from [employer] Sit to Stand: Mod assist;+2 physical assistance;+2 safety/equipment Stand pivot transfers: +2 physical assistance;Mod assist       General transfer comment: from EOB>BSC on his L side, pt RLE buckling with L steps toward BSC so needs R knee blocked and assist to manage and remain close to RW; impulsive  to sit needing heavier assist to safely sit on BSC. Encouraged RN to use Stedy for safety with pivot toward R side from BSC>back to bed  Ambulation/Gait                 Stairs             Wheelchair Mobility    Modified Rankin (Stroke Patients Only) Modified Rankin (Stroke Patients Only) Pre-Morbid Rankin Score: No symptoms Modified Rankin: Severe disability     Balance Overall balance assessment: Needs assistance Sitting-balance support: Single extremity supported;Feet supported Sitting balance-Leahy Scale: Fair     Standing balance support: Bilateral upper extremity supported;During functional activity Standing balance-Leahy Scale: Poor Standing balance comment: RLE buckling when pivoting with RW, needs manual assist to manage RLE and RW due to bowel urgency/pt rushing; at least +2 modA                            Cognition Arousal/Alertness: Awake/alert Behavior During Therapy: Impulsive Overall Cognitive Status: Impaired/Different from baseline Area of Impairment: Safety/judgement;Problem solving                       Following Commands: Follows one step commands consistently Safety/Judgement: Decreased awareness of safety;Decreased awareness of deficits   Problem Solving: Requires verbal cues;Requires tactile cues General Comments: Pt benefits from positive reinforcement on his progress from previous session and motivated to try getting to [facility] this evening, pt mildly impulsive due to bowel urgency. Pt mother also present and supportive.  Exercises      General Comments        Pertinent Vitals/Pain Pain Assessment: Faces Faces Pain Scale: Hurts a little bit Pain Location: generalized discomfort Pain Descriptors / Indicators: Discomfort Pain Intervention(s): Limited activity within patient's tolerance;Monitored during session    Home Living                      Prior Function            PT Goals (current  goals can now be found in the care plan section) Acute Rehab PT Goals Patient Stated Goal: to return home walking and see my dog Patches PT Goal Formulation: With patient Time For Goal Achievement: 06/22/21 Progress towards PT goals: Progressing toward goals    Frequency    Min 4X/week      PT Plan Current plan remains appropriate    Co-evaluation              AM-PAC PT "6 Clicks" Mobility   Outcome Measure  Help needed turning from your back to your side while in a flat bed without using bedrails?: A Little Help needed moving from lying on your back to sitting on the side of a flat bed without using bedrails?: A Little Help needed moving to and from a bed to a chair (including a wheelchair)?: A Little Help needed standing up from a chair using your arms (e.g., wheelchair or bedside chair)?: A Lot Help needed to walk in hospital room?: Total Help needed climbing 3-5 steps with a railing? : Total 6 Click Score: 13    End of Session Equipment Utilized During Treatment: Gait belt Activity Tolerance: Patient tolerated treatment well Patient left: with call bell/phone within reach;with family/visitor present (pt on [facility], RN aware, pt mother nearby for safety, call bell next to him) Nurse Communication: Mobility status;Other (comment);Need for lift equipment (either scoot to strong L side drop arm chair or use Stedy for pivot back to bed from [facility] due to RLE buckling/hemiplegia) PT Visit Diagnosis: Unsteadiness on feet (R26.81);Muscle weakness (generalized) (M62.81);Other abnormalities of gait and mobility (R26.89);Hemiplegia and hemiparesis Hemiplegia - Right/Left: Right Hemiplegia - dominant/non-dominant: Dominant Hemiplegia - caused by: Cerebral infarction     Time: 1443-1540 PT Time Calculation (min) (ACUTE ONLY): 9 min  Charges:  $Therapeutic Activity: 8-22 mins                     Kaston Faughn P., PTA Acute Rehabilitation Services Pager: [phone] Office:  [phone]    Angus [place]ms 06/09/2021, 7:18 PM
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
## 2021-06-10 LAB — BASIC METABOLIC PANEL
Anion gap: 10 (ref 5–15)
BUN: 10 mg/dL (ref 6–20)
CO2: 22 mmol/L (ref 22–32)
Calcium: 9.3 mg/dL (ref 8.9–10.3)
Chloride: 106 mmol/L (ref 98–111)
Creatinine, Ser: 0.68 mg/dL (ref 0.61–1.24)
GFR, Estimated: >60 mL/min (ref >60)
Glucose, Bld: 113 mg/dL — ABNORMAL HIGH (ref 70–99)
Potassium: 3.7 mmol/L (ref 3.5–5.1)
Sodium: 138 mmol/L (ref 135–145)

## 2021-06-10 LAB — CBC
HCT: 42.6 % (ref 39.0–52.0)
Hemoglobin: 14.7 g/dL (ref 13.0–17.0)
MCH: 34.3 pg — ABNORMAL HIGH (ref 26.0–34.0)
MCHC: 34.5 g/dL (ref 30.0–36.0)
MCV: 99.3 fL (ref 80.0–100.0)
Platelets: 303 10*3/uL (ref 150–400)
RBC: 4.29 MIL/uL (ref 4.22–5.81)
RDW: 13 % (ref 11.5–15.5)
WBC: 7.7 10*3/uL (ref 4.0–10.5)
nRBC: 0 % (ref 0.0–0.2)

## 2021-06-10 MED ORDER — ACETAMINOPHEN-CODEINE #4 300-60 MG PO TABS
1.0000 | ORAL_TABLET | Freq: Every day | ORAL | Status: DC
Start: 2021-06-10 — End: 2021-06-10

## 2021-06-10 MED ORDER — ACETAMINOPHEN-CODEINE #3 300-30 MG PO TABS
1.0000 | ORAL_TABLET | Freq: Every day | ORAL | Status: DC
  Administered 2021-06-10: 1 via ORAL
  Filled 2021-06-10: qty 1

## 2021-06-10 MED ORDER — LORAZEPAM 0.5 MG PO TABS
0.5000 mg | ORAL_TABLET | Freq: Once | ORAL | Status: AC | PRN
  Administered 2021-06-10: 0.5 mg via ORAL
  Filled 2021-06-10: qty 1

## 2021-06-10 NOTE — Progress Notes (Signed)
PROGRESS NOTE    Christian Russell  GYK:[id number] DOB: 02-11-1971 DOA: 06/06/2021 PCP: Patient, No Pcp Per (Inactive)  Brief Narrative: 49/M with history of depression presented to the ED on 10/10 with acute onset right-sided weakness and dysarthria, CT head at [facility] noted left basal ganglia intracranial hemorrhage, he was started on Cleviprex for blood pressure control and transferred to [facility], Admitted to neuro ICU. -CTA brain was negative for aneurysm or AVM -transferred to [facility] service by neurology today 10/13   Assessment & Plan:   Left basal ganglia hemorrhagic stroke -With resultant dense right hemiplegia and dysarthria -Suspected to be related to uncontrolled hypertension -CTA head and neck negative for aneurysm or AVM -Repeat MRI brain noted stable left basal ganglia hemorrhage measuring 3.9 X2.1X 1.4 cm with mild surrounding edema, no intraventricular extension -2D echo noted preserved EF, LDL is 111 now on statin -Hemoglobin A1c is 5.4 -PT OT, SLP following -Awaiting CIR for rehab  Insomnia -Continue melatonin, apparently takes Tylenol#3, will order this  Hypertension -BP stable on current regimen of amlodipine and lisinopril  Dyslipidemia -LDL 111, now on statin  THC positive -Counseled against this due to stroke risk  DVT prophylaxis: SCDs Code Status: Full code Family Communication: No family at bedside, mother was updated by stroke team yesterday Disposition Plan:  Status is: Inpatient  Remains inpatient appropriate because:Inpatient level of care appropriate due to severity of illness  Dispo: The patient is from: Home              Anticipated d/c is to: CIR              Patient currently is not medically stable to d/c.   Difficult to place patient No   Consultants:  Neurology  Procedures:   Antimicrobials:    Subjective: -Had some agitation and issues with sleep overnight  Objective: Vitals:   06/09/21 2332 06/10/21 0415 06/10/21  0823 06/10/21 1158  BP: 108/77 109/82 111/75 111/70  Pulse: 92 95 99 90  Resp: 16 17 18 16   Temp: 98 F (36.7 C) 98.1 F (36.7 C) 98 F (36.7 C) 98.9 F (37.2 C)  TempSrc: Oral Oral Axillary Oral  SpO2: 99% 98% 97% 97%  Height:        Intake/Output Summary (Last 24 hours) at 06/10/2021 1435 Last data filed at 06/10/2021 1303 Gross per 24 hour  Intake 800 ml  Output 525 ml  Net 275 ml   There were no vitals filed for this visit.  Examination: , General exam:Gen: Awake, Alert, Oriented X 2 HEENT: no JVD Lungs: Good air movement bilaterally, CTAB CVS: S1S2/RRR Abd: soft, Non tender, non distended, BS present Extremities: No edema Skin: no new rashes on exposed skin   Neuro: Dense right hemiplegia, mild dysarthria Psychiatry:  Mood & affect appropriate.     Data Reviewed:   CBC: Recent Labs  Lab 06/06/21 2233 06/06/21 2234 06/10/21 0309  WBC 8.2  --  7.7  NEUTROABS 5.6  --   --   HGB 15.0 15.3 14.7  HCT 44.8 45.0 42.6  MCV 103.5*  --  99.3  PLT 313  --  303   Basic Metabolic Panel: Recent Labs  Lab 06/06/21 2234 06/10/21 0309  NA 136  140 138  K 4.1  4.4 3.7  CL 103  103 106  CO2 26 22  GLUCOSE 118*  118* 113*  BUN 11  10 10   CREATININE 0.84  0.80 0.68  CALCIUM 8.7* 9.3  GFR: CrCl cannot be calculated (Unknown ideal weight.). Liver Function Tests: Recent Labs  Lab 06/06/21 2234  AST 28  ALT 27  ALKPHOS 76  BILITOT 0.6  PROT 7.5  ALBUMIN 4.3   No results for input(s): LIPASE, AMYLASE in the last 168 hours. No results for input(s): AMMONIA in the last 168 hours. Coagulation Profile: Recent Labs  Lab 06/06/21 2234  INR 1.0   Cardiac Enzymes: No results for input(s): CKTOTAL, CKMB, CKMBINDEX, TROPONINI in the last 168 hours. BNP (last 3 results) No results for input(s): PROBNP in the last 8760 hours. HbA1C: No results for input(s): HGBA1C in the last 72 hours.  CBG: Recent Labs  Lab 06/06/21 2227  GLUCAP 106*   Lipid  Profile: No results for input(s): CHOL, HDL, LDLCALC, TRIG, CHOLHDL, LDLDIRECT in the last 72 hours.  Thyroid Function Tests: No results for input(s): TSH, T4TOTAL, FREET4, T3FREE, THYROIDAB in the last 72 hours. Anemia Panel: No results for input(s): VITAMINB12, FOLATE, FERRITIN, TIBC, IRON, RETICCTPCT in the last 72 hours. Urine analysis:    Component Value Date/Time   COLORURINE YELLOW 06/06/2021 2251   APPEARANCEUR CLEAR 06/06/2021 2251   LABSPEC 1.019 06/06/2021 2251   PHURINE 5.0 06/06/2021 2251   GLUCOSEU NEGATIVE 06/06/2021 2251   HGBUR NEGATIVE 06/06/2021 2251   BILIRUBINUR NEGATIVE 06/06/2021 2251   KETONESUR NEGATIVE 06/06/2021 2251   PROTEINUR NEGATIVE 06/06/2021 2251   NITRITE NEGATIVE 06/06/2021 2251   LEUKOCYTESUR NEGATIVE 06/06/2021 2251   Sepsis Labs: @LABRCNTIP (procalcitonin:4,lacticidven:4)  ) Recent Results (from the past 240 hour(s))  Resp Panel by RT-PCR (Flu A&B, Covid) Nasopharyngeal Swab     Status: None   Collection Time: 06/06/21 10:33 PM   Specimen: Nasopharyngeal Swab; Nasopharyngeal(NP) swabs in vial transport medium  Result Value Ref Range Status   SARS Coronavirus 2 by RT PCR NEGATIVE NEGATIVE Final    Comment: (NOTE) SARS-CoV-2 target nucleic acids are NOT DETECTED.  The SARS-CoV-2 RNA is generally detectable in upper respiratory specimens during the acute phase of infection. The lowest concentration of SARS-CoV-2 viral copies this assay can detect is 138 copies/mL. A negative result does not preclude SARS-Cov-2 infection and should not be used as the sole basis for treatment or other patient management decisions. A negative result may occur with  improper specimen collection/handling, submission of specimen other than nasopharyngeal swab, presence of viral mutation(s) within the areas targeted by this assay, and inadequate number of viral copies(<138 copies/mL). A negative result must be combined with clinical observations, patient  history, and epidemiological information. The expected result is Negative.  Fact Sheet for Patients:  08/06/21  Fact Sheet for Healthcare Providers:  BloggerCourse.com  This test is no t yet approved or cleared by the [place] FDA and  has been authorized for detection and/or diagnosis of SARS-CoV-2 by FDA under an Emergency Use Authorization (EUA). This EUA will remain  in effect (meaning this test can be used) for the duration of the COVID-19 declaration under Section 564(b)(1) of the Act, 21 U.S.C.section 360bbb-3(b)(1), unless the authorization is terminated  or revoked sooner.       Influenza A by PCR NEGATIVE NEGATIVE Final   Influenza B by PCR NEGATIVE NEGATIVE Final    Comment: (NOTE) The Xpert Xpress SARS-CoV-2/FLU/RSV plus assay is intended as an aid in the diagnosis of influenza from Nasopharyngeal swab specimens and should not be used as a sole basis for treatment. Nasal washings and aspirates are unacceptable for Xpert Xpress SARS-CoV-2/FLU/RSV testing.  Fact Sheet for Patients: [place]  Fact Sheet for Healthcare Providers: SeriousBroker.it  This test is not yet approved or cleared by the [place] FDA and has been authorized for detection and/or diagnosis of SARS-CoV-2 by FDA under an Emergency Use Authorization (EUA). This EUA will remain in effect (meaning this test can be used) for the duration of the COVID-19 declaration under Section 564(b)(1) of the Act, 21 U.S.C. section 360bbb-3(b)(1), unless the authorization is terminated or revoked.  Performed at [facility], [address] [place], [place] [address]   MRSA Next Gen by PCR, Nasal     Status: None   Collection Time: 06/07/21  1:33 AM   Specimen: Nasal Mucosa; Nasal Swab  Result Value Ref Range Status   MRSA by PCR Next Gen NOT DETECTED NOT DETECTED Final    Comment:  (NOTE) The GeneXpert MRSA Assay (FDA approved for NASAL specimens only), is one component of a comprehensive MRSA colonization surveillance program. It is not intended to diagnose MRSA infection nor to guide or monitor treatment for MRSA infections. Test performance is not FDA approved in patients less than 41 years old. Performed at [facility], [address] [place], [place] [address]     Scheduled Meds:  acetaminophen-codeine  1 tablet Oral QHS   amLODipine  5 mg Oral Daily   atorvastatin  80 mg Oral Daily   lisinopril  5 mg Oral Daily   melatonin  3 mg Oral QHS   pantoprazole  40 mg Oral QHS   senna-docusate  1 tablet Oral BID   Continuous Infusions:   LOS: 4 days    Time spent:    Zannie Cove, MD [facility]   06/10/2021, 2:35 PM

## 2021-06-10 NOTE — Progress Notes (Signed)
Physical Therapy Treatment Patient Details Name: Christian Russell MRN: [id number] DOB: 1971-03-15 Today's Date: 06/10/2021   History of Present Illness 50 y.o. 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 PMH: Depression, HLD.    PT Comments    Pt received in supine, reporting he had just gotten back to supine after sitting up in chair a while but agreeable to therapy session and with good participation and tolerance for seated/standing trials with RLE blocked and standing/seated RLE/UE exercises as detailed below. Pt reporting another poor night of sleep, MD notified he states he normally takes Tylenol PM at home when he can't sleep. Pt continues to demonstrate no active R dorsiflexion and may need to trial AFO and KI to progress stepping in future sessions. Pt needing modA for prolonged static standing at [facility] and minA for squat scooting to chair toward L side. Pt continues to benefit from PT services to progress toward functional mobility goals.   Recommendations for follow up therapy are one component of a multi-disciplinary discharge planning process, led by the attending physician.  Recommendations may be updated based on patient status, additional functional criteria and insurance authorization.  Follow Up Recommendations  CIR     Equipment Recommendations  Other (comment) (TBD)    Recommendations for Other Services Rehab consult     Precautions / Restrictions Precautions Precautions: Fall Precaution Comments: BP goal <220/120     Mobility  Bed Mobility Overal bed mobility: Needs Assistance Bed Mobility: Supine to Sit     Supine to sit: Min assist     General bed mobility comments: to L EOB, good initiation minA for safety and RLE assist, use of bed rail/HOB partially elevated    Transfers Overall transfer level: Needs assistance Equipment used: Hemi-walker Transfers: Sit to/from [facility] Sit to Stand: Mod assist   Squat pivot transfers: Min assist     General transfer comment: from EOB drop arm recliner on his L side, pt needing minA via transfer pad and mod cues for UE placement/improved technique. Pt able to stand x5 reps, 2 from EOB and 3 from chair prior to fatiguing.  Ambulation/Gait Ambulation/Gait assistance: Mod assist Gait Distance (Feet):  (pre-gait hip flexion x10 reps x2 sets and weight shifting, RLE buckling despite knee blocking so deferred steps unable able to get KI or +2 assist) Assistive device: Hemi-walker       General Gait Details: pre-gait standing only, unable to step with LLE due to RLE buckling   Stairs             Wheelchair Mobility    Modified Rankin (Stroke Patients Only) Modified Rankin (Stroke Patients Only) Pre-Morbid Rankin Score: No symptoms Modified Rankin: Severe disability     Balance Overall balance assessment: Needs assistance Sitting-balance support: Single extremity supported;Feet supported Sitting balance-Leahy Scale: Fair     Standing balance support: Bilateral upper extremity supported;During functional activity Standing balance-Leahy Scale: Poor Standing balance comment: RLE buckling in stance and blocked while standing at [facility], pt able to stand with modA up to 3 minutes at a time including RLE/UE therex prior to sitting  Seated RLE AROM: hip flexion, LAQ x10 reps ea                          Cognition Arousal/Alertness: Awake/alert Behavior During Therapy: Impulsive Overall Cognitive Status: Impaired/Different from baseline Area of Impairment: Safety/judgement;Problem  solving                       Following Commands: Follows one step commands consistently Safety/Judgement: Decreased awareness of safety;Decreased awareness of deficits   Problem Solving: Requires verbal cues;Requires tactile cues General Comments: Pt benefits from positive reinforcement on his  progress from previous session and motivated to try getting to [facility] this evening, pt mildly impulsive due to bowel urgency. Pt mother also present and supportive.      Exercises Other Exercises Other Exercises: STS x 5 total reps for BLE strengthening Other Exercises: standing RLE hip flexion x10 reps x2 trials Other Exercises: RUE AROM: elbow flexion x10 reps standing at L HW Other Exercises: seated RUE AROM: gross grasp/finger extension (mostly just 1st and 2nd finger activation), elbow flex/ext AROM x10 reps ea    General Comments General comments (skin integrity, edema, etc.): HR to 117 bpm with exertion      Pertinent Vitals/Pain Faces Pain Scale: Hurts a little bit Pain Location: generalized discomfort Pain Descriptors / Indicators: Discomfort    Home Living                      Prior Function            PT Goals (current goals can now be found in the care plan section) Acute Rehab PT Goals Patient Stated Goal: to return home walking and see my dog Patches PT Goal Formulation: With patient Time For Goal Achievement: 06/22/21 Progress towards PT goals: Progressing toward goals    Frequency    Min 4X/week      PT Plan Current plan remains appropriate    Co-evaluation              AM-PAC PT "6 Clicks" Mobility   Outcome Measure  Help needed turning from your back to your side while in a flat bed without using bedrails?: A Little Help needed moving from lying on your back to sitting on the side of a flat bed without using bedrails?: A Little Help needed moving to and from a bed to a chair (including a wheelchair)?: A Little Help needed standing up from a chair using your arms (e.g., wheelchair or bedside chair)?: A Lot Help needed to walk in hospital room?: Total Help needed climbing 3-5 steps with a railing? : Total 6 Click Score: 13    End of Session Equipment Utilized During Treatment: Gait belt Activity Tolerance: Patient tolerated  treatment well Patient left: with call bell/phone within reach;in chair;with chair alarm set (pt c/o fatigue after poor sleep overnight and also sat up for an hour recently but agreeable to try for 1 more hour) Nurse Communication: Mobility status;Other (comment) (pt reports he takes tylenol PM normally at night when he can't sleep, MD notified) PT Visit Diagnosis: Unsteadiness on feet (R26.81);Muscle weakness (generalized) (M62.81);Other abnormalities of gait and mobility (R26.89);Hemiplegia and hemiparesis Hemiplegia - Right/Left: Right Hemiplegia - dominant/non-dominant: Dominant Hemiplegia - caused by: Cerebral infarction     Time: 7062-3762 PT Time Calculation (min) (ACUTE ONLY): 26 min  Charges:  $Therapeutic Exercise: 8-22 mins $Neuromuscular Re-education: 8-22 mins                     Charonda Hefter P., PTA Acute Rehabilitation Services Pager: [phone] Office: [phone]    Angus Palms 06/10/2021, 2:59 PM

## 2021-06-10 NOTE — Progress Notes (Signed)
STROKE TEAM PROGRESS NOTE   INTERVAL HISTORY No one is at the bedside at time of exam.  He is resting comfortably in bed..  Vital signs stable.  Blood pressure adequately controlled.  He is awaiting transfer to rehab when bed becomes available..  Neurological exam is unchanged Vitals:   06/09/21 2332 06/10/21 0415 06/10/21 0823 06/10/21 1158  BP: 108/77 109/82 111/75 111/70  Pulse: 92 95 99 90  Resp: 16 17 18 16   Temp: 98 F (36.7 C) 98.1 F (36.7 C) 98 F (36.7 C) 98.9 F (37.2 C)  TempSrc: Oral Oral Axillary Oral  SpO2: 99% 98% 97% 97%  Height:       CBC:  Recent Labs  Lab 06/06/21 2233 06/06/21 2234 06/10/21 0309  WBC 8.2  --  7.7  NEUTROABS 5.6  --   --   HGB 15.0 15.3 14.7  HCT 44.8 45.0 42.6  MCV 103.5*  --  99.3  PLT 313  --  303   Basic Metabolic Panel:  Recent Labs  Lab 06/06/21 2234 06/10/21 0309  NA 136  140 138  K 4.1  4.4 3.7  CL 103  103 106  CO2 26 22  GLUCOSE 118*  118* 113*  BUN 11  10 10   CREATININE 0.84  0.80 0.68  CALCIUM 8.7* 9.3    Lipid Panel:  Recent Labs  Lab 06/07/21 0328  CHOL 205*  TRIG 153*  HDL 63  CHOLHDL 3.3  VLDL 31  LDLCALC *    HgbA1c:  Recent Labs  Lab 06/07/21 0328  HGBA1C 5.4   Urine Drug Screen:  Recent Labs  Lab 06/06/21 2251  LABOPIA POSITIVE*  COCAINSCRNUR NONE DETECTED  LABBENZ NONE DETECTED  AMPHETMU NONE DETECTED  THCU POSITIVE*  LABBARB NONE DETECTED    Alcohol Level  Recent Labs  Lab 06/06/21 2233  ETH <10    IMAGING past 24 hours No results found.  PHYSICAL EXAM  General: Pleasant middle-aged Caucasian male laying comfortably in bed; in no acute distress.  HENT: Normal oropharynx and mucosa. Normal external appearance of ears and nose.  Neck: Supple, no pain or tenderness  CV: No JVD. No peripheral edema.  Pulmonary: Symmetric Chest rise. Normal respiratory effort.  Abdomen: Soft to touch, non-tender.  Ext: No cyanosis, edema, or deformity  Skin: No rash. Normal  palpation of skin.   Musculoskeletal: Normal digits and nails by inspection. No clubbing.    Neurologic Examination  Mental status/Cognition: Alert, oriented to self, place, month and year, good attention.  Speech/language: dysarthric speech, fluent, comprehension intact, object naming intact, repetition intact.  Cranial nerves:   CN II Pupils equal and reactive to light, no VF deficits    CN III,IV,VI EOM intact, no gaze preference or deviation, no nystagmus    CN V normal sensation in V1, V2, and V3 segments bilaterally    CN VII R facial droop   CN VIII normal hearing to speech    CN IX & X normal palatal elevation, no uvular deviation    CN XI 5/5 head turn and 5/5 shoulder shrug bilaterally    CN XII midline tongue protrusion    Coordination/Complex Motor:  Right hemiparesis with grade 1/5 right upper extremity strength proximally and 0/5 distally.  Right lower extremity strength is 2-3/5 proximally and distally. - Finger to Nose intact on the left - Heel to shin unable to do 2/2 weakness. - Rapid alternating movement are intact on the left. - Gait: unable and unsafe to  assess with profound R sided weakness.  ASSESSMENT/PLAN [name] is a 50 y.o. male with no significant medical history who presents with R sided weakness and dysarthria. He initially presented to [facility] ED where a STAT CTH w/o contrast demonstrated left basal ganglia ICH. He was started on Cleviprex for blood pressure control and transferred for neurology evaluation and ICU admission at [facility]. MRI brain is pending.   Stroke , left basal ganglia hemorrhagic: Due to uncontrolled hypertension CT head: left basal ganglia and internal capsule hemorrhage  CTA head & neck:  Normal CTA of the head and neck. No aneurysm or vascular malformation. The intraparenchymal hematoma is most consistent with hypertensive hemorrhage. MRI  brain stable appearance of the 2.1 x 1.4 x 3.9 cm left basal ganglia hemorrhage  extending into thalamus, and left middle cerebral peduncle with mild surrounding edema but no intraventricular extension.  2D Echo left ventricular ejection fraction 60 to 65%. LDL 111 HgbA1c 5.4 VTE prophylaxis - scd     Diet   Diet regular Room service appropriate? Yes with Assist; Fluid consistency: Thin   No antithrombotic prior to admission, now on No antithrombotic. hemorrhage Therapy recommendations:   CLR  disposition:  pending  Hypertension Home meds:  none Stable Permissive hypertension (OK if < 220/120) but gradually normalize in 5-7 days Long-term BP goal normotensive  Hyperlipidemia Home meds:  none LDL 111, goal < 70 Add lipitor   High intensity statin   Continue statin at discharge   HgbA1c 5.4, goal < 7.0 CBGs No results for input(s): GLUCAP in the last 72 hours.   SSI  Other Stroke Risk Factors    Substance abuse - UDS:  THC POSITIVE, Cocaine NONE DETECTED. Patient advised to stop using due to stroke risk.   Hospital day # 4  Continue ongoing therapies.  Inpatient rehab consult awaited.  Hopefully transfer to rehab in the next few days when bed available.  Discussed with patient and with his mother over the phone and answered questions.  Discussed with Dr. Jomarie Longs.  I spoke to his mother yesterday and gave him an update.  Greater than 50% time during this 25-minute visit was spent on counseling and coordination of care and discussion with care team about his stroke and need for rehab.  Stroke team will sign off.  Kindly call for questions.  Follow-up as an outpatient stroke clinic in 2 months     Delia Heady, MD Medical Director [facility] Pager: [phone] 06/10/2021 1:16 PM   To contact Stroke Continuity provider, please refer to [email or web address]. After hours, contact General Neurology

## 2021-06-10 NOTE — Evaluation (Addendum)
Clinical/Bedside Swallow Evaluation Patient Details  Name: Christian Russell MRN: [id number] Date of Birth: [birth year]  Today's Date: 06/10/2021 Time: SLP Start Time (ACUTE ONLY): 1528 SLP Stop Time (ACUTE ONLY): 1550 SLP Time Calculation (min) (ACUTE ONLY): 22 min  Past Medical History:  Past Medical History:  Diagnosis Date   Medical history non-contributory    Past Surgical History:  Past Surgical History:  Procedure Laterality Date   NO PAST SURGERIES     HPI:  50 y.o. 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    Assessment / Plan / Recommendation  Clinical Impression  ST following pt for cognition and notified by PTA who witnessed coughing episodes during liquid consumption. Pt reported he has been throat clearing with po's recently and stated he had reflux in his 20's. Oral-motor exam was unremarkable with pt affirming reduced right facial sensation. SLP trialed pt with various consistencies, head positions and manner of acceptance (cup versus straw). Reduced laryngeal protection is suspected indicated by consistent immediate/delayed throat clears and delayed cough with thin. Frequency and severity of aspiration indicators were decreased with nectar thick liquids. Chin tuck did not appear to make significant difference. Until objective swallow assessment is completed, pt in agreement for nectar thick liquids; continue regular. SLP Visit Diagnosis: Dysphagia, unspecified (R13.10)    Aspiration Risk  Mild aspiration risk    Diet Recommendation Regular;Nectar-thick liquid   Liquid Administration via: Cup;Straw Medication Administration: Whole meds with puree Supervision: Staff to assist with self feeding;Full supervision/cueing for compensatory strategies Compensations: Slow rate;Small sips/bites Postural Changes: Seated upright at 90 degrees    Other  Recommendations Oral Care Recommendations: Oral  care BID    Recommendations for follow up therapy are one component of a multi-disciplinary discharge planning process, led by the attending physician.  Recommendations may be updated based on patient status, additional functional criteria and insurance authorization.  Follow up Recommendations Inpatient Rehab      Frequency and Duration min 2x/week  2 weeks       Prognosis Prognosis for Safe Diet Advancement: Good      Swallow Study   General Date of Onset: 06/06/21 HPI: 50 y.o. 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 Type of Study: Bedside Swallow Evaluation Previous Swallow Assessment:  (none) Diet Prior to this Study: Regular;Thin liquids Temperature Spikes Noted: No Respiratory Status: Room air History of Recent Intubation: No Behavior/Cognition: Alert;Cooperative;Pleasant mood Oral Cavity Assessment: Within Functional Limits Oral Care Completed by SLP: No Oral Cavity - Dentition: Poor condition;Missing dentition Vision: Functional for self-feeding Self-Feeding Abilities: Needs assist Patient Positioning: Upright in bed Baseline Vocal Quality: Normal Volitional Cough: Weak Volitional Swallow: Able to elicit    Oral/Motor/Sensory Function Overall Oral Motor/Sensory Function: Within functional limits (pt reports decreased sensation on right)   Ice Chips Ice chips: Not tested   Thin Liquid Thin Liquid: Impaired Presentation: Cup;Straw Oral Phase Impairments:  (none) Pharyngeal  Phase Impairments: Cough - Delayed;Throat Clearing - Immediate;Throat Clearing - Delayed    Nectar Thick Nectar Thick Liquid: Impaired Presentation: Cup;Straw Pharyngeal Phase Impairments: Throat Clearing - Delayed   Honey Thick Honey Thick Liquid: Not tested   Puree Puree: Not tested   Solid     Solid: Within functional limits      Royce Macadamia 06/10/2021,5:22 PM  Breck Coons Lonell Face.Ed  [employer]  Pathologist Pager [phone] Office [phone]

## 2021-06-11 MED ORDER — TEMAZEPAM 7.5 MG PO CAPS
7.5000 mg | ORAL_CAPSULE | Freq: Every day | ORAL | Status: DC
  Administered 2021-06-11 – 2021-06-12 (×2): 7.5 mg via ORAL
  Filled 2021-06-11 (×2): qty 1

## 2021-06-11 NOTE — Progress Notes (Signed)
PROGRESS NOTE    ADARRYL GOLDAMMER  JSE:[id number] DOB: 1971-04-28 DOA: 06/06/2021 PCP: Patient, No Pcp Per (Inactive)  Brief Narrative: 50/M with history of depression presented to the ED on 10/10 with acute onset right-sided weakness and dysarthria, CT head at [facility] noted left basal ganglia intracranial hemorrhage, he was started on Cleviprex for blood pressure control and transferred to [facility], Admitted to neuro ICU. -CTA brain was negative for aneurysm or AVM -transferred to [facility] service by neurology today 10/13   Assessment & Plan:   Left basal ganglia hemorrhagic stroke -resultant dense right hemiplegia and dysarthria -Suspected to be related to uncontrolled hypertension -CTA head and neck negative for aneurysm or AVM -Repeat MRI brain noted stable left basal ganglia hemorrhage measuring 3.9 X2.1X 1.4 cm with mild surrounding edema, no intraventricular extension -2D echo noted preserved EF, LDL is 111 now on statin -Hemoglobin A1c is 5.4 -PT OT, SLP following -Neurology has signed off, now awaiting CIR for rehab  Insomnia -Continue melatonin, try temazepam tonight  Hypertension -BP soft today and overnight, discontinue lisinopril, continue amlodipine  Dyslipidemia -LDL 111, now on statin  THC positive -Counseled against this due to stroke risk  DVT prophylaxis: SCDs Code Status: Full code Family Communication: No family at bedside, mother was updated by stroke team yesterday Disposition Plan:  Status is: Inpatient  Remains inpatient appropriate because:Inpatient level of care appropriate due to severity of illness  Dispo: The patient is from: Home              Anticipated d/c is to: CIR              Patient currently is not medically stable to d/c.   Difficult to place patient No   Consultants:  Neurology  Procedures:   Antimicrobials:    Subjective: -Had some agitation and issues with sleep overnight  Objective: Vitals:   06/10/21 2355  06/11/21 0457 06/11/21 0801 06/11/21 1145  BP: 99/75 (!) 88/72 111/70 113/81  Pulse: 95 89 83 88  Resp: 18 18 16 18   Temp: 98.5 F (36.9 C) 98.1 F (36.7 C) 97.9 F (36.6 C) 98 F (36.7 C)  TempSrc: Oral Oral Oral Oral  SpO2: 98% 95% 100% 98%  Height:        Intake/Output Summary (Last 24 hours) at 06/11/2021 1358 Last data filed at 06/11/2021 1200 Gross per 24 hour  Intake 360 ml  Output 350 ml  Net 10 ml   There were no vitals filed for this visit.  Examination: , General exam:Gen: Awake, Alert, Oriented X 2 HEENT: no JVD Lungs: Good air movement bilaterally, CTAB CVS: S1S2/RRR Abd: soft, Non tender, non distended, BS present Extremities: No edema Skin: no new rashes on exposed skin   Neuro: Dense right hemiplegia, mild dysarthria Psychiatry:  Mood & affect appropriate.     Data Reviewed:   CBC: Recent Labs  Lab 06/06/21 2233 06/06/21 2234 06/10/21 0309  WBC 8.2  --  7.7  NEUTROABS 5.6  --   --   HGB 15.0 15.3 14.7  HCT 44.8 45.0 42.6  MCV 103.5*  --  99.3  PLT 313  --  303   Basic Metabolic Panel: Recent Labs  Lab 06/06/21 2234 06/10/21 0309  NA 136  140 138  K 4.1  4.4 3.7  CL 103  103 106  CO2 26 22  GLUCOSE 118*  118* 113*  BUN 11  10 10   CREATININE 0.84  0.80 0.68  CALCIUM 8.7*  9.3   GFR: CrCl cannot be calculated (Unknown ideal weight.). Liver Function Tests: Recent Labs  Lab 06/06/21 2234  AST 28  ALT 27  ALKPHOS 76  BILITOT 0.6  PROT 7.5  ALBUMIN 4.3   No results for input(s): LIPASE, AMYLASE in the last 168 hours. No results for input(s): AMMONIA in the last 168 hours. Coagulation Profile: Recent Labs  Lab 06/06/21 2234  INR 1.0   Cardiac Enzymes: No results for input(s): CKTOTAL, CKMB, CKMBINDEX, TROPONINI in the last 168 hours. BNP (last 3 results) No results for input(s): PROBNP in the last 8760 hours. HbA1C: No results for input(s): HGBA1C in the last 72 hours.  CBG: Recent Labs  Lab 06/06/21 2227   GLUCAP 106*   Lipid Profile: No results for input(s): CHOL, HDL, LDLCALC, TRIG, CHOLHDL, LDLDIRECT in the last 72 hours.  Thyroid Function Tests: No results for input(s): TSH, T4TOTAL, FREET4, T3FREE, THYROIDAB in the last 72 hours. Anemia Panel: No results for input(s): VITAMINB12, FOLATE, FERRITIN, TIBC, IRON, RETICCTPCT in the last 72 hours. Urine analysis:    Component Value Date/Time   COLORURINE YELLOW 06/06/2021 2251   APPEARANCEUR CLEAR 06/06/2021 2251   LABSPEC 1.019 06/06/2021 2251   PHURINE 5.0 06/06/2021 2251   GLUCOSEU NEGATIVE 06/06/2021 2251   HGBUR NEGATIVE 06/06/2021 2251   BILIRUBINUR NEGATIVE 06/06/2021 2251   KETONESUR NEGATIVE 06/06/2021 2251   PROTEINUR NEGATIVE 06/06/2021 2251   NITRITE NEGATIVE 06/06/2021 2251   LEUKOCYTESUR NEGATIVE 06/06/2021 2251   Sepsis Labs: @LABRCNTIP (procalcitonin:4,lacticidven:4)  ) Recent Results (from the past 240 hour(s))  Resp Panel by RT-PCR (Flu A&B, Covid) Nasopharyngeal Swab     Status: None   Collection Time: 06/06/21 10:33 PM   Specimen: Nasopharyngeal Swab; Nasopharyngeal(NP) swabs in vial transport medium  Result Value Ref Range Status   SARS Coronavirus 2 by RT PCR NEGATIVE NEGATIVE Final    Comment: (NOTE) SARS-CoV-2 target nucleic acids are NOT DETECTED.  The SARS-CoV-2 RNA is generally detectable in upper respiratory specimens during the acute phase of infection. The lowest concentration of SARS-CoV-2 viral copies this assay can detect is 138 copies/mL. A negative result does not preclude SARS-Cov-2 infection and should not be used as the sole basis for treatment or other patient management decisions. A negative result may occur with  improper specimen collection/handling, submission of specimen other than nasopharyngeal swab, presence of viral mutation(s) within the areas targeted by this assay, and inadequate number of viral copies(<138 copies/mL). A negative result must be combined with clinical  observations, patient history, and epidemiological information. The expected result is Negative.  Fact Sheet for Patients:  08/06/21  Fact Sheet for Healthcare Providers:  BloggerCourse.com  This test is no t yet approved or cleared by the [employer]iousBro[employer].[place] FDA and  has been authorized for detection and/or diagnosis of SARS-CoV-2 by FDA under an Emergency Use Authorization (EUA). This EUA will remain  in effect (meaning this test can be used) for the duration of the COVID-19 declaration under Section 564(b)(1) of the Act, 21 U.S.C.section 360bbb-3(b)(1), unless the authorization is terminated  or revoked sooner.       Influenza A by PCR NEGATIVE NEGATIVE Final   Influenza B by PCR NEGATIVE NEGATIVE Final    Comment: (NOTE) The Xpert Xpress SARS-CoV-2/FLU/RSV plus assay is intended as an aid in the diagnosis of influenza from Nasopharyngeal swab specimens and should not be used as a sole basis for treatment. Nasal washings and aspirates are unacceptable for Xpert Xpress SARS-CoV-2/FLU/RSV testing.  Fact Sheet  for Patients: BloggerCourse.com  Fact Sheet for Healthcare Providers: SeriousBroker.it  This test is not yet approved or cleared by the [place] FDA and has been authorized for detection and/or diagnosis of SARS-CoV-2 by FDA under an Emergency Use Authorization (EUA). This EUA will remain in effect (meaning this test can be used) for the duration of the COVID-19 declaration under Section 564(b)(1) of the Act, 21 U.S.C. section 360bbb-3(b)(1), unless the authorization is terminated or revoked.  Performed at [facility], [address] [place], [place] [address]   MRSA Next Gen by PCR, Nasal     Status: None   Collection Time: 06/07/21  1:33 AM   Specimen: Nasal Mucosa; Nasal Swab  Result Value Ref Range Status   MRSA by PCR Next Gen NOT DETECTED NOT DETECTED  Final    Comment: (NOTE) The GeneXpert MRSA Assay (FDA approved for NASAL specimens only), is one component of a comprehensive MRSA colonization surveillance program. It is not intended to diagnose MRSA infection nor to guide or monitor treatment for MRSA infections. Test performance is not FDA approved in patients less than 19 years old. Performed at [facility], [address] [place], [place] [address]     Scheduled Meds:  amLODipine  5 mg Oral Daily   atorvastatin  80 mg Oral Daily   melatonin  3 mg Oral QHS   pantoprazole  40 mg Oral QHS   senna-docusate  1 tablet Oral BID   temazepam  7.5 mg Oral QHS   Continuous Infusions:   LOS: 5 days    Time spent:  Zannie Cove, MD [facility]   06/11/2021, 1:58 PM

## 2021-06-11 NOTE — Progress Notes (Signed)
Pt would like the Flu shot before dischg

## 2021-06-12 MED ORDER — ACETAMINOPHEN-CODEINE #3 300-30 MG PO TABS
1.0000 | ORAL_TABLET | Freq: Every day | ORAL | Status: DC
  Administered 2021-06-12 – 2021-06-15 (×4): 1 via ORAL
  Filled 2021-06-12 (×4): qty 1

## 2021-06-12 MED ORDER — ACETAMINOPHEN 500 MG PO TABS
1000.0000 mg | ORAL_TABLET | Freq: Three times a day (TID) | ORAL | Status: DC | PRN
  Administered 2021-06-12 – 2021-06-16 (×8): 1000 mg via ORAL
  Filled 2021-06-12 (×8): qty 2

## 2021-06-12 MED ORDER — METHOCARBAMOL 500 MG PO TABS
500.0000 mg | ORAL_TABLET | Freq: Three times a day (TID) | ORAL | Status: DC | PRN
  Administered 2021-06-12: 500 mg via ORAL
  Filled 2021-06-12: qty 1

## 2021-06-12 MED ORDER — METHOCARBAMOL 750 MG PO TABS
750.0000 mg | ORAL_TABLET | Freq: Four times a day (QID) | ORAL | Status: DC | PRN
  Administered 2021-06-12 – 2021-06-16 (×8): 750 mg via ORAL
  Filled 2021-06-12 (×8): qty 1

## 2021-06-12 NOTE — Progress Notes (Signed)
PROGRESS NOTE    Christian Russell  XHB:[id number] DOB: 08-05-1971 DOA: 06/06/2021 PCP: Patient, No Pcp Per (Inactive)  Brief Narrative: 49/M with history of depression presented to the ED on 10/10 with acute onset right-sided weakness and dysarthria, CT head at [facility] noted left basal ganglia intracranial hemorrhage, he was started on Cleviprex for blood pressure control and transferred to [facility], Admitted to neuro ICU. -CTA brain was negative for aneurysm or AVM -transferred to [facility] service by neurology on10/13 -Has been medically stable, awaiting CIR   Assessment & Plan:   Left basal ganglia hemorrhagic stroke -resultant dense right hemiplegia and dysarthria -Suspected to be related to uncontrolled hypertension -CTA head and neck negative for aneurysm or AVM -Repeat MRI brain noted stable left basal ganglia hemorrhage measuring 3.9 X2.1X 1.4 cm with mild surrounding edema, no intraventricular extension -2D echo noted preserved EF, LDL is 111 now on statin -Hemoglobin A1c is 5.4 -PT OT, SLP following -Neurology has signed off, now awaiting CIR for rehab -Back on thickened liquids now, SLP to follow  Insomnia -severe and persistent despite multiple med attempts -Continue melatonin, temazepam, Tylenol 3   Hypertension -BP has been running softer, amlodipine at low-dose continued, lisinopril discontinued  Dyslipidemia -LDL 111, now on statin  THC positive -Counseled against this due to stroke risk  DVT prophylaxis: SCDs Code Status: Full code Family Communication: No family at bedside Disposition Plan:  Status is: Inpatient  Remains inpatient appropriate because:Inpatient level of care appropriate due to severity of illness  Dispo: The patient is from: Home              Anticipated d/c is to: CIR              Patient currently is not medically stable to d/c.   Difficult to place patient No   Consultants:  Neurology  Procedures:   Antimicrobials:     Subjective: -Had some agitation and issues with sleep overnight  Objective: Vitals:   06/11/21 2026 06/11/21 2326 06/12/21 0332 06/12/21 0808  BP: 98/65 110/90 102/75 105/77  Pulse: 88 87 89 94  Resp: 17 18 16 18   Temp: 98.7 F (37.1 C) 98 F (36.7 C) 97.7 F (36.5 C)   TempSrc:  Oral Oral   SpO2: 98% 97% 95% 99%  Height:        Intake/Output Summary (Last 24 hours) at 06/12/2021 1140 Last data filed at 06/12/2021 0855 Gross per 24 hour  Intake 960 ml  Output 600 ml  Net 360 ml   There were no vitals filed for this visit.  Examination: , General exam:Gen: Awake, Alert, Oriented X 2 HEENT: no JVD Lungs: Good air movement bilaterally, CTAB CVS: S1S2/RRR Abd: soft, Non tender, non distended, BS present Extremities: No edema Skin: no new rashes on exposed skin   Neuro: Dense right hemiplegia, mild dysarthria Psychiatry:  Mood & affect appropriate.     Data Reviewed:   CBC: Recent Labs  Lab 06/06/21 2233 06/06/21 2234 06/10/21 0309  WBC 8.2  --  7.7  NEUTROABS 5.6  --   --   HGB 15.0 15.3 14.7  HCT 44.8 45.0 42.6  MCV 103.5*  --  99.3  PLT 313  --  303   Basic Metabolic Panel: Recent Labs  Lab 06/06/21 2234 06/10/21 0309  NA 136  140 138  K 4.1  4.4 3.7  CL 103  103 106  CO2 26 22  GLUCOSE 118*  118* 113*  BUN 11  10 10  CREATININE 0.84  0.80 0.68  CALCIUM 8.7* 9.3   GFR: CrCl cannot be calculated (Unknown ideal weight.). Liver Function Tests: Recent Labs  Lab 06/06/21 2234  AST 28  ALT 27  ALKPHOS 76  BILITOT 0.6  PROT 7.5  ALBUMIN 4.3   No results for input(s): LIPASE, AMYLASE in the last 168 hours. No results for input(s): AMMONIA in the last 168 hours. Coagulation Profile: Recent Labs  Lab 06/06/21 2234  INR 1.0   Cardiac Enzymes: No results for input(s): CKTOTAL, CKMB, CKMBINDEX, TROPONINI in the last 168 hours. BNP (last 3 results) No results for input(s): PROBNP in the last 8760 hours. HbA1C: No results for  input(s): HGBA1C in the last 72 hours.  CBG: Recent Labs  Lab 06/06/21 2227  GLUCAP 106*   Lipid Profile: No results for input(s): CHOL, HDL, LDLCALC, TRIG, CHOLHDL, LDLDIRECT in the last 72 hours.  Thyroid Function Tests: No results for input(s): TSH, T4TOTAL, FREET4, T3FREE, THYROIDAB in the last 72 hours. Anemia Panel: No results for input(s): VITAMINB12, FOLATE, FERRITIN, TIBC, IRON, RETICCTPCT in the last 72 hours. Urine analysis:    Component Value Date/Time   COLORURINE YELLOW 06/06/2021 2251   APPEARANCEUR CLEAR 06/06/2021 2251   LABSPEC 1.019 06/06/2021 2251   PHURINE 5.0 06/06/2021 2251   GLUCOSEU NEGATIVE 06/06/2021 2251   HGBUR NEGATIVE 06/06/2021 2251   BILIRUBINUR NEGATIVE 06/06/2021 2251   KETONESUR NEGATIVE 06/06/2021 2251   PROTEINUR NEGATIVE 06/06/2021 2251   NITRITE NEGATIVE 06/06/2021 2251   LEUKOCYTESUR NEGATIVE 06/06/2021 2251   Sepsis Labs: @LABRCNTIP (procalcitonin:4,lacticidven:4)  ) Recent Results (from the past 240 hour(s))  Resp Panel by RT-PCR (Flu A&B, Covid) Nasopharyngeal Swab     Status: None   Collection Time: 06/06/21 10:33 PM   Specimen: Nasopharyngeal Swab; Nasopharyngeal(NP) swabs in vial transport medium  Result Value Ref Range Status   SARS Coronavirus 2 by RT PCR NEGATIVE NEGATIVE Final    Comment: (NOTE) SARS-CoV-2 target nucleic acids are NOT DETECTED.  The SARS-CoV-2 RNA is generally detectable in upper respiratory specimens during the acute phase of infection. The lowest concentration of SARS-CoV-2 viral copies this assay can detect is 138 copies/mL. A negative result does not preclude SARS-Cov-2 infection and should not be used as the sole basis for treatment or other patient management decisions. A negative result may occur with  improper specimen collection/handling, submission of specimen other than nasopharyngeal swab, presence of viral mutation(s) within the areas targeted by this assay, and inadequate number of  viral copies(<138 copies/mL). A negative result must be combined with clinical observations, patient history, and epidemiological information. The expected result is Negative.  Fact Sheet for Patients:  08/06/21  Fact Sheet for Healthcare Providers:  BloggerCourse.com  This test is no t yet approved or cleared by the [employer] FDA and  has been authorized for detection and/or diagnosis of SARS-CoV-2 by FDA under an Emergency Use Authorization (EUA). This EUA will remain  in effect (meaning this test can be used) for the duration of the COVID-19 declaration under Section 564(b)(1) of the Act, 21 U.S.C.section 360bbb-3(b)(1), unless the authorization is terminated  or revoked sooner.       Influenza A by PCR NEGATIVE NEGATIVE Final   Influenza B by PCR NEGATIVE NEGATIVE Final    Comment: (NOTE) The Xpert Xpress SARS-CoV-2/FLU/RSV plus assay is intended as an aid in the diagnosis of influenza from Nasopharyngeal swab specimens and should not be used as a sole basis for treatment. Nasal washings and  aspirates are unacceptable for Xpert Xpress SARS-CoV-2/FLU/RSV testing.  Fact Sheet for Patients: BloggerCourse.com  Fact Sheet for Healthcare Providers: SeriousBroker.it  This test is not yet approved or cleared by the [place] FDA and has been authorized for detection and/or diagnosis of SARS-CoV-2 by FDA under an Emergency Use Authorization (EUA). This EUA will remain in effect (meaning this test can be used) for the duration of the COVID-19 declaration under Section 564(b)(1) of the Act, 21 U.S.C. section 360bbb-3(b)(1), unless the authorization is terminated or revoked.  Performed at [facility], [address] [place], [place] [address]   MRSA Next Gen by PCR, Nasal     Status: None   Collection Time: 06/07/21  1:33 AM   Specimen: Nasal Mucosa; Nasal Swab   Result Value Ref Range Status   MRSA by PCR Next Gen NOT DETECTED NOT DETECTED Final    Comment: (NOTE) The GeneXpert MRSA Assay (FDA approved for NASAL specimens only), is one component of a comprehensive MRSA colonization surveillance program. It is not intended to diagnose MRSA infection nor to guide or monitor treatment for MRSA infections. Test performance is not FDA approved in patients less than 67 years old. Performed at [facility], [address] [place], [place] [address]     Scheduled Meds:  acetaminophen-codeine  1 tablet Oral QHS   amLODipine  5 mg Oral Daily   atorvastatin  80 mg Oral Daily   melatonin  3 mg Oral QHS   pantoprazole  40 mg Oral QHS   senna-docusate  1 tablet Oral BID   temazepam  7.5 mg Oral QHS   Continuous Infusions:   LOS: 6 days    Time spent:  Zannie Cove, MD [facility]   06/12/2021, 11:40 AM

## 2021-06-12 NOTE — Progress Notes (Signed)
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

## 2021-06-13 ENCOUNTER — Inpatient Hospital Stay (HOSPITAL_COMMUNITY): Payer: BC Managed Care – PPO

## 2021-06-13 MED ORDER — TEMAZEPAM 15 MG PO CAPS
15.0000 mg | ORAL_CAPSULE | Freq: Every day | ORAL | Status: DC
  Administered 2021-06-13 – 2021-06-15 (×3): 15 mg via ORAL
  Filled 2021-06-13: qty 1
  Filled 2021-06-13: qty 2
  Filled 2021-06-13: qty 1

## 2021-06-13 MED ORDER — OXYCODONE HCL 5 MG PO TABS
5.0000 mg | ORAL_TABLET | Freq: Four times a day (QID) | ORAL | Status: DC | PRN
  Administered 2021-06-13 – 2021-06-15 (×9): 5 mg via ORAL
  Filled 2021-06-13 (×9): qty 1

## 2021-06-13 MED ORDER — LORAZEPAM 1 MG PO TABS
1.0000 mg | ORAL_TABLET | Freq: Once | ORAL | Status: AC | PRN
  Administered 2021-06-13: 1 mg via ORAL
  Filled 2021-06-13: qty 1

## 2021-06-13 NOTE — Progress Notes (Signed)
Inpatient Rehab Admissions Coordinator:    I do not have a CIR bed for this Pt. Today. I will continue to follow for potential admit pending insurance auth and bed availability.   Chalee Hirota, MS, CCC-SLP Rehab Admissions Coordinator  [phone] (celll) [phone] (office)   

## 2021-06-13 NOTE — Progress Notes (Signed)
Physical Therapy Treatment Patient Details Name: Christian Russell MRN: [id number] DOB: [birth year] Today's Date: 06/13/2021   History of Present Illness 50 y.o. 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 PMH: Depression, HLD.    PT Comments    Pt received in chair, pleasantly cooperative and agreeable to therapy session. Emphasis on seated RUE/LE exercises and techniques to reduce fall risk. Of note, pt Rt ankle noted to have fallen between recliner and bed frame while pt seated in recliner and encouraged him to notify staff if he needs repositioning to prevent injury, small scrape noted to pt Rt lateral ankle but pt denies pain or discomfort and unsure if it was there before today. Pt needing modA for stand pivot toward weaker Rt side and minA for sit>stand to [facility] and to perform bed mobility. Pt with continued RLE buckling while stepping and will need +2 for safety with gait progression but noted to have slightly improved R knee movement with AROM exercises and trace R ankle dorsiflexion this date. Session time limited due to arrival of transport tech to take pt for swallow study. Pt continues to benefit from PT services to progress toward functional mobility goals. Continue to recommend CIR.  Recommendations for follow up therapy are one component of a multi-disciplinary discharge planning process, led by the attending physician.  Recommendations may be updated based on patient status, additional functional criteria and insurance authorization.  Follow Up Recommendations  CIR     Equipment Recommendations  Other (comment) (TBD)    Recommendations for Other Services Rehab consult     Precautions / Restrictions Precautions Precautions: Fall Precaution Comments: BP goal <220/120 Restrictions Weight Bearing Restrictions: No     Mobility  Bed Mobility Overal bed mobility: Needs Assistance Bed Mobility: Sit to  Supine       Sit to supine: Min assist   General bed mobility comments: BLE assist, pt with good initiation, needs cues for safe technique    Transfers Overall transfer level: Needs assistance Equipment used: Hemi-walker Transfers: Sit to/from [employer] Sit to Stand: Min assist Stand pivot transfers: Mod assist       General transfer comment: From chair toward EOB to R side, pt with good initiation to stand but needs assist for R knee block and trunk stability.  Ambulation/Gait Ambulation/Gait assistance: Mod assist Gait Distance (Feet): 3 Feet Assistive device: Hemi-walker Gait Pattern/deviations: Step-to pattern;Shuffle;Decreased weight shift to right;Decreased step length - right;Decreased dorsiflexion - right     General Gait Details: needs RLE blocked for safety, pt able to take a few pivotal steps from chair>bed with R ankle ace wrapped for foot drop prevention, heavy reliance on RW on L side for stability   Stairs             Wheelchair Mobility    Modified Rankin (Stroke Patients Only) Modified Rankin (Stroke Patients Only) Pre-Morbid Rankin Score: No symptoms Modified Rankin: Severe disability     Balance Overall balance assessment: Needs assistance Sitting-balance support: Single extremity supported;Feet supported Sitting balance-Leahy Scale: Fair     Standing balance support: Bilateral upper extremity supported;During functional activity Standing balance-Leahy Scale: Poor Standing balance comment: RLE buckling in stance and blocked while standing and stepping at [facility], pt unsteady needs at least modA for safety while taking pivotal steps toward EOB  Cognition Arousal/Alertness: Awake/alert Behavior During Therapy: WFL for tasks assessed/performed Overall Cognitive Status: Impaired/Different from baseline Area of Impairment: Safety/judgement;Problem solving;Memory                      Memory: Decreased short-term memory Following Commands: Follows one step commands consistently Safety/Judgement: Decreased awareness of safety;Decreased awareness of deficits Awareness: Emergent Problem Solving: Requires verbal cues;Requires tactile cues General Comments: Pt benefits from positive reinforcement on his progress from previous session and very motivated to progress RUE/LE strength, pt reports he's been trying to get OOB to chair and move his arm/leg as much as possible. He self-reports agitation over weekend due to poor sleep and that he's still "working on that" but pt calm/cooperative during session.      Exercises General Exercises - Lower Extremity Ankle Circles/Pumps: AROM;10 reps;Both;Seated (pt able to minimally dorsiflex RLE, an improvement from previous session) Long Arc Quad: 10 reps;Seated;AROM;Both (unable to reach TKE but partial ROM achieved) Hip Flexion/Marching: Seated;AROM;Both;15 reps Hand Exercises Wrist Flexion: Self ROM;Right;10 reps;Seated Wrist Extension: Self ROM;Right;10 reps;Seated Other Exercises Other Exercises: STS from chair<>HW and HW>EOB Other Exercises: RUE AROM: elbow flexion x10 reps seated in chair, encouraged R shoulder flexion also but very limited AROM Other Exercises: seated RUE AROM: gross grasp/finger extension (mostly just 1st and 2nd finger activation)    General Comments General comments (skin integrity, edema, etc.): HR elevated 108 bpm with seated exercises. Pt c/o headache on L side, RN notified but UTA VS otherwise as transport arrived to take pt to MBS study.      Pertinent Vitals/Pain Pain Assessment: Faces Pain Score: 7  Faces Pain Scale: No hurt Pain Location: L sided headache Pain Descriptors / Indicators: Discomfort;Headache;Throbbing Pain Intervention(s): Limited activity within patient's tolerance;Monitored during session;Premedicated before session;Repositioned;Patient requesting pain meds-RN notified (RN  notified, pt had already left for MBS study with transport when PTA was able to notify her)    Home Living                      Prior Function            PT Goals (current goals can now be found in the care plan section) Acute Rehab PT Goals Patient Stated Goal: to return home walking and see my dog Patches PT Goal Formulation: With patient Time For Goal Achievement: 06/22/21 Progress towards PT goals: Progressing toward goals    Frequency    Min 4X/week      PT Plan Current plan remains appropriate    Co-evaluation              AM-PAC PT "6 Clicks" Mobility   Outcome Measure  Help needed turning from your back to your side while in a flat bed without using bedrails?: A Little Help needed moving from lying on your back to sitting on the side of a flat bed without using bedrails?: A Little Help needed moving to and from a bed to a chair (including a wheelchair)?: A Little Help needed standing up from a chair using your arms (e.g., wheelchair or bedside chair)?: A Lot Help needed to walk in hospital room?: Total Help needed climbing 3-5 steps with a railing? : Total 6 Click Score: 13    End of Session Equipment Utilized During Treatment: Gait belt Activity Tolerance: Patient tolerated treatment well Patient left: with call bell/phone within reach;in bed;Other (comment) (in care of transport take to go to [facility]) Nurse Communication: Mobility status;Other (comment) (pt  c/o L sided headache) PT Visit Diagnosis: Unsteadiness on feet (R26.81);Muscle weakness (generalized) (M62.81);Other abnormalities of gait and mobility (R26.89);Hemiplegia and hemiparesis Hemiplegia - Right/Left: Right Hemiplegia - dominant/non-dominant: Dominant Hemiplegia - caused by: Cerebral infarction     Time: 1235-1254 PT Time Calculation (min) (ACUTE ONLY): 19 min  Charges:  $Therapeutic Exercise: 8-22 mins                     Etana Beets P., PTA Acute Rehabilitation Services Pager:  [phone] Office: [phone]    Angus Palms 06/13/2021, 1:37 PM

## 2021-06-13 NOTE — Progress Notes (Signed)
Speech Language Pathology Treatment:    Patient Details Name: Christian Russell MRN: [id number] DOB: 1971-04-06 Today's Date: 06/13/2021 Time:  -     MBS scheduled for pt at 1:00 today                 GO                Royce Macadamia  06/13/2021, 8:47 AM   Breck Coons Lonell Face.Ed Nurse, children's [phone] Office [phone]

## 2021-06-13 NOTE — Progress Notes (Signed)
PROGRESS NOTE    Christian Russell  IOX:[id number] DOB: 07-29-1971 DOA: 06/06/2021 PCP: Patient, No Pcp Per (Inactive)  Brief Narrative: 49/M with history of depression presented to the ED on 10/10 with acute onset right-sided weakness and dysarthria, CT head at [facility] noted left basal ganglia intracranial hemorrhage, he was started on Cleviprex for blood pressure control and transferred to [facility], Admitted to neuro ICU. -CTA brain was negative for aneurysm or AVM -transferred to [facility] service by neurology on10/13 -Has been medically stable, awaiting CIR   Assessment & Plan:   Left basal ganglia hemorrhagic stroke -resultant dense right hemiplegia and dysarthria -Suspected to be related to uncontrolled hypertension -CTA head and neck negative for aneurysm or AVM -Repeat MRI brain noted stable left basal ganglia hemorrhage measuring 3.9 X2.1X 1.4 cm with mild surrounding edema, no intraventricular extension -2D echo noted preserved EF, LDL is 111 now on statin -Hemoglobin A1c is 5.4 -PT OT, SLP following, back on thickened liquids now, SLP following -Neurology signed off, awaiting CIR for rehab  Insomnia -severe and persistent despite multiple med attempts -Continue melatonin, temazepam, Tylenol 3, increase temazepam dose, required IV Ativan again last night  Hypertension -BP has been running softer, amlodipine at low-dose continued, lisinopril discontinued  Dyslipidemia -LDL 111, now on statin  THC positive -Counseled against this due to stroke risk  DVT prophylaxis: SCDs Code Status: Full code Family Communication: No family at bedside Disposition Plan:  Status is: Inpatient  Remains inpatient appropriate because:Inpatient level of care appropriate due to severity of illness  Dispo: The patient is from: Home              Anticipated d/c is to: CIR              Patient currently is not medically stable to d/c.   Difficult to place patient No   Consultants:   Neurology  Procedures:   Antimicrobials:    Subjective: -Last night required IV Ativan again to sleep despite 3 sleep aids  Objective: Vitals:   06/12/21 2014 06/13/21 0030 06/13/21 0746 06/13/21 1206  BP: 115/78 122/83 114/86 124/90  Pulse: 98 80 88 94  Resp: 20 18 18 18   Temp:  98 F (36.7 C) (!) 97.4 F (36.3 C) 97.7 F (36.5 C)  TempSrc: Oral Oral Oral Oral  SpO2: 98% 97% 99% 97%  Height:        Intake/Output Summary (Last 24 hours) at 06/13/2021 1410 Last data filed at 06/13/2021 1200 Gross per 24 hour  Intake 180 ml  Output 200 ml  Net -20 ml   There were no vitals filed for this visit.  Examination: , General exam:Gen: Awake, Alert, Oriented X 2  HEENT: no JVD Lungs: Good air movement bilaterally, CTAB CVS: S1S2/RRR Abd: soft, Non tender, non distended, BS present Extremities: No edema Skin: no new rashes on exposed skin  Neuro: Dense right hemiplegia, dysarthria has resolved Psychiatry:  Mood & affect appropriate.     Data Reviewed:   CBC: Recent Labs  Lab 06/06/21 2233 06/06/21 2234 06/10/21 0309  WBC 8.2  --  7.7  NEUTROABS 5.6  --   --   HGB 15.0 15.3 14.7  HCT 44.8 45.0 42.6  MCV 103.5*  --  99.3  PLT 313  --  303   Basic Metabolic Panel: Recent Labs  Lab 06/06/21 2234 06/10/21 0309  NA 136  140 138  K 4.1  4.4 3.7  CL 103  103 106  CO2  26 22  GLUCOSE 118*  118* 113*  BUN 11  10 10   CREATININE 0.84  0.80 0.68  CALCIUM 8.7* 9.3   GFR: CrCl cannot be calculated (Unknown ideal weight.). Liver Function Tests: Recent Labs  Lab 06/06/21 2234  AST 28  ALT 27  ALKPHOS 76  BILITOT 0.6  PROT 7.5  ALBUMIN 4.3   No results for input(s): LIPASE, AMYLASE in the last 168 hours. No results for input(s): AMMONIA in the last 168 hours. Coagulation Profile: Recent Labs  Lab 06/06/21 2234  INR 1.0   Cardiac Enzymes: No results for input(s): CKTOTAL, CKMB, CKMBINDEX, TROPONINI in the last 168 hours. BNP (last 3  results) No results for input(s): PROBNP in the last 8760 hours. HbA1C: No results for input(s): HGBA1C in the last 72 hours.  CBG: Recent Labs  Lab 06/06/21 2227  GLUCAP 106*   Lipid Profile: No results for input(s): CHOL, HDL, LDLCALC, TRIG, CHOLHDL, LDLDIRECT in the last 72 hours.  Thyroid Function Tests: No results for input(s): TSH, T4TOTAL, FREET4, T3FREE, THYROIDAB in the last 72 hours. Anemia Panel: No results for input(s): VITAMINB12, FOLATE, FERRITIN, TIBC, IRON, RETICCTPCT in the last 72 hours. Urine analysis:    Component Value Date/Time   COLORURINE YELLOW 06/06/2021 2251   APPEARANCEUR CLEAR 06/06/2021 2251   LABSPEC 1.019 06/06/2021 2251   PHURINE 5.0 06/06/2021 2251   GLUCOSEU NEGATIVE 06/06/2021 2251   HGBUR NEGATIVE 06/06/2021 2251   BILIRUBINUR NEGATIVE 06/06/2021 2251   KETONESUR NEGATIVE 06/06/2021 2251   PROTEINUR NEGATIVE 06/06/2021 2251   NITRITE NEGATIVE 06/06/2021 2251   LEUKOCYTESUR NEGATIVE 06/06/2021 2251   Sepsis Labs: @LABRCNTIP (procalcitonin:4,lacticidven:4)  ) Recent Results (from the past 240 hour(s))  Resp Panel by RT-PCR (Flu A&B, Covid) Nasopharyngeal Swab     Status: None   Collection Time: 06/06/21 10:33 PM   Specimen: Nasopharyngeal Swab; Nasopharyngeal(NP) swabs in vial transport medium  Result Value Ref Range Status   SARS Coronavirus 2 by RT PCR NEGATIVE NEGATIVE Final    Comment: (NOTE) SARS-CoV-2 target nucleic acids are NOT DETECTED.  The SARS-CoV-2 RNA is generally detectable in upper respiratory specimens during the acute phase of infection. The lowest concentration of SARS-CoV-2 viral copies this assay can detect is 138 copies/mL. A negative result does not preclude SARS-Cov-2 infection and should not be used as the sole basis for treatment or other patient management decisions. A negative result may occur with  improper specimen collection/handling, submission of specimen other than nasopharyngeal swab, presence of  viral mutation(s) within the areas targeted by this assay, and inadequate number of viral copies(<138 copies/mL). A negative result must be combined with clinical observations, patient history, and epidemiological information. The expected result is Negative.  Fact Sheet for Patients:   Fact Sheet for Healthcare Providers:  08/06/21  This test is no t yet approved or cleared by the [employer] FDA and  has been authorized for detection and/or diagnosis of SARS-CoV-2 by FDA under an Emergency Use Authorization (EUA). This EUA will remain  in effect (meaning this test can be used) for the duration of the COVID-19 declaration under Section 564(b)(1) of the Act, 21 U.S.C.section 360bbb-3(b)(1), unless the authorization is terminated  or revoked sooner.       Influenza A by PCR NEGATIVE NEGATIVE Final   Influenza B by PCR NEGATIVE NEGATIVE Final    Comment: (NOTE) The Xpert Xpress SARS-CoV-2/FLU/RSV plus assay is intended as an aid in the diagnosis of influenza from Nasopharyngeal swab specimens and should  not be used as a sole basis for treatment. Nasal washings and aspirates are unacceptable for Xpert Xpress SARS-CoV-2/FLU/RSV testing.  Fact Sheet for Patients: [employer]com  Fact Sheet for Healthcare Providers: [employer]ious[employer]ker[employer]it  This test is not yet approved or cleared by the [place] FDA and has been authorized for detection and/or diagnosis of SARS-CoV-2 by FDA under an Emergency Use Authorization (EUA). This EUA will remain in effect (meaning this test can be used) for the duration of the COVID-19 declaration under Section 564(b)(1) of the Act, 21 U.S.C. section 360bbb-3(b)(1), unless the authorization is terminated or revoked.  Performed at [facility], [address], [place], [place] [address]   MRSA Next Gen by PCR, Nasal      Status: None   Collection Time: 06/07/21  1:33 AM   Specimen: Nasal Mucosa; Nasal Swab  Result Value Ref Range Status   MRSA by PCR Next Gen NOT DETECTED NOT DETECTED Final    Comment: (NOTE) The GeneXpert MRSA Assay (FDA approved for NASAL specimens only), is one component of a comprehensive MRSA colonization surveillance program. It is not intended to diagnose MRSA infection nor to guide or monitor treatment for MRSA infections. Test performance is not FDA approved in patients less than 79 years old. Performed at [facility], [address] [place], [place] [address]     Scheduled Meds:  acetaminophen-codeine  1 tablet Oral QHS   amLODipine  5 mg Oral Daily   atorvastatin  80 mg Oral Daily   melatonin  3 mg Oral QHS   pantoprazole  40 mg Oral QHS   senna-docusate  1 tablet Oral BID   temazepam  7.5 mg Oral QHS   Continuous Infusions:   LOS: 7 days    Time spent:  Zannie Cove, MD [facility]   06/13/2021, 2:10 PM

## 2021-06-13 NOTE — Progress Notes (Signed)
Modified Barium Swallow Progress Note  Patient Details  Name: Christian Russell MRN: [id number] Date of Birth: Jun 14, 1971  Today's Date: 06/13/2021  Modified Barium Swallow completed.  Full report located under Chart Review in the Imaging Section.  Brief recommendations include the following:  Clinical Impression  Pt was seen for an MBS. Overall, pt presents with mild pharyngeal dysphagia c/b intermittent penetration with thin liquids from cup and straw and aspiration prevalent with simultaneous swallow of pills/thin liquid secondary to decreased timing of epiglottic inversion. Orally, pt exhibited no impairments across consistencies. Pt exhibited flash penetration (PAS 2) with thin liquid on most presentations but exhibited penetration above the level of the vocal folds that did not clear (PAS 3) x1. Puree and regular solids were intact orally and pharyngeally. When presented with pill and thin liquid, pt exhibited trace aspiration with thin which did not clear despite attempts (PAS 7). Pill lodged in the vallecula after first swallow but pt elicited multiple swallows independently with pill clearing on the third. Recommened regular/thin liquid diet, administering medications whole with puree. SLP will follow for management of diet tolerance.   Swallow Evaluation Recommendations       SLP Diet Recommendations: Regular solids;Thin liquid   Liquid Administration via: Cup;Straw   Medication Administration: Whole meds with puree   Supervision: Patient able to self feed   Compensations: Slow rate;Small sips/bites;Clear throat intermittently   Postural Changes: Remain semi-upright after after feeds/meals (Comment);Seated upright at 90 degrees   Oral Care Recommendations: Oral care BID        Royce Macadamia 06/13/2021,3:06 PM

## 2021-06-14 MED ORDER — SENNOSIDES-DOCUSATE SODIUM 8.6-50 MG PO TABS: 1.0000 | ORAL_TABLET | Freq: Two times a day (BID) | ORAL | Status: AC

## 2021-06-14 MED ORDER — PANTOPRAZOLE SODIUM 40 MG PO TBEC: 40.0000 mg | DELAYED_RELEASE_TABLET | Freq: Every day | ORAL | Status: DC

## 2021-06-14 MED ORDER — AMLODIPINE BESYLATE 5 MG PO TABS: 5.0000 mg | ORAL_TABLET | Freq: Every day | ORAL | Status: DC

## 2021-06-14 MED ORDER — ATORVASTATIN CALCIUM 80 MG PO TABS: 80.0000 mg | ORAL_TABLET | Freq: Every day | ORAL | Status: DC

## 2021-06-14 MED ORDER — MELATONIN 3 MG PO TABS: 3.0000 mg | ORAL_TABLET | Freq: Every day | ORAL | 0 refills | Status: DC

## 2021-06-14 MED ORDER — OXYCODONE HCL 5 MG PO TABS: 5.0000 mg | ORAL_TABLET | Freq: Four times a day (QID) | ORAL | 0 refills | Status: DC | PRN

## 2021-06-14 MED ORDER — METHOCARBAMOL 750 MG PO TABS: 750.0000 mg | ORAL_TABLET | Freq: Four times a day (QID) | ORAL | Status: DC | PRN

## 2021-06-14 NOTE — Progress Notes (Signed)
Inpatient Rehab Admissions Coordinator:   I do not have a bed for this Pt. Today on CIR . I did receive insurance auth for admission this morning. I will pursue for potential admit pending bed availability.   Megan Salon, MS, CCC-SLP Rehab Admissions Coordinator  [phone] (celll) [phone] (office)

## 2021-06-14 NOTE — Progress Notes (Signed)
Speech Language Pathology Treatment: Dysphagia  Patient Details Name: Christian Russell MRN: [id number] DOB: Apr 17, 1971 Today's Date: 06/14/2021 Time: 6546-5035 SLP Time Calculation (min) (ACUTE ONLY): 8 min  Assessment / Plan / Recommendation Clinical Impression  Pt was seen for therapy targeting swallowing goals. Pt reported no difficulty with breakfast or dinner tray, however did report an episode of coughing last night when given whole pills with thin liquid. SLP educated pt regarding compensatory strategy of taking medications whole with puree and importance of self-advocacy along with adding environmental cues to facilitate whole medications with puree strategy. Recommend regular/thin liquid diet, continuing to administer pills whole with puree to mitigate chances of aspiration during medication administration. SLP will continue to follow for management of diet toleration.    HPI HPI: 50 y.o. 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      SLP Plan  Continue with current plan of care      Recommendations for follow up therapy are one component of a multi-disciplinary discharge planning process, led by the attending physician.  Recommendations may be updated based on patient status, additional functional criteria and insurance authorization.    Recommendations  Diet recommendations: Regular;Thin liquid Liquids provided via: Straw;Cup Medication Administration: Whole meds with puree Supervision: Patient able to self feed Compensations: Slow rate;Small sips/bites;Clear throat intermittently Postural Changes and/or Swallow Maneuvers: Seated upright 90 degrees;Upright 30-60 min after meal                General recommendations: Rehab consult Oral Care Recommendations: Oral care BID Follow up Recommendations: Inpatient Rehab SLP Visit Diagnosis: Dysphagia, pharyngeal phase (R13.13) Plan: Continue with  current plan of care       GO             Jeannie Done, SLP-Student    Jeannie Done  06/14/2021, 4:22 PM

## 2021-06-14 NOTE — Discharge Summary (Signed)
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 MRSA infection nor to guide or monitor treatment for MRSA infections. Test performance is not FDA approved in patients less than 61 years old. Performed at [facility], [address] [place], [place] [address]      Labs: Basic Metabolic Panel: Recent Labs  Lab 06/10/21 0309  NA 138  K 3.7  CL 106  CO2 22  GLUCOSE 113*  BUN 10  CREATININE 0.68  CALCIUM 9.3   Liver Function Tests: No results for input(s): AST, ALT, ALKPHOS, BILITOT, PROT, ALBUMIN in the last 168 hours. No results for input(s): LIPASE, AMYLASE in the last 168 hours. No results for input(s): AMMONIA in the last 168 hours. CBC: Recent Labs  Lab 06/10/21 0309  WBC 7.7  HGB 14.7  HCT 42.6  MCV 99.3  PLT 303   Cardiac Enzymes: No results for input(s): CKTOTAL, CKMB, CKMBINDEX, TROPONINI in the last 168 hours. BNP: BNP (last 3 results) No results for input(s): BNP in the last 8760  hours.  ProBNP (last 3  results) No results for input(s): PROBNP in the last 8760 hours.  CBG: No results for input(s): GLUCAP in the last 168 hours.  Signed:  Zannie Cove MD.  [facility] 06/14/2021, 1:53 PM

## 2021-06-14 NOTE — Progress Notes (Signed)
Occupational Therapy Treatment Patient Details Name: Christian Russell MRN: [id number] DOB: [birth year] Today's Date: 06/14/2021   History of present illness 50 y.o. 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 PMH: Depression, HLD.   OT comments  Pt progressing towards established OT goals. Pt highly motivated and demonstrate good rehab potential. Pt participating in functional mobility wit Min A +2 and platform walker. Pt participating in RUE exercises for normalizing AROM and movement patterns. Continue to recommend dc to CIR and will continue to follow acutely as admitted.   Recommendations for follow up therapy are one component of a multi-disciplinary discharge planning process, led by the attending physician.  Recommendations may be updated based on patient status, additional functional criteria and insurance authorization.    Follow Up Recommendations  CIR    Equipment Recommendations  3 in 1 bedside commode    Recommendations for Other Services Rehab consult    Precautions / Restrictions Precautions Precautions: Fall Precaution Comments: BP goal <220/120       Mobility Bed Mobility Overal bed mobility: Needs Assistance Bed Mobility: Supine to Sit Rolling: Min guard         General bed mobility comments: Min Guard A for safety    Transfers Overall transfer level: Needs assistance Equipment used: Right platform walker Transfers: Sit to/from [employer] Transfers Sit to Stand: Min assist Stand pivot transfers: Min assist       General transfer comment: Min A for power up into standing and balance balance    Balance Overall balance assessment: Needs assistance Sitting-balance support: Single extremity supported;Feet supported Sitting balance-Leahy Scale: Fair     Standing balance support: Bilateral upper extremity supported;During functional activity Standing balance-Leahy  Scale: Poor Standing balance comment: requiring phsyical A for maintaining standing                           ADL either performed or assessed with clinical judgement   ADL Overall ADL's : Needs assistance/impaired                     Lower Body Dressing: Minimal assistance;Sit to/from stand Lower Body Dressing Details (indicate cue type and reason): doning sock with one hand. Min A in standing Toilet Transfer: Minimal assistance;+2 for safety/equipment;Ambulation           Functional mobility during ADLs: Minimal assistance;+2 for physical assistance;+2 for safety/equipment (W/ hemiwalker) General ADL Comments: Pt performing functional mobility and then RUE exercises at table top. highly motivated     Vision   Vision Assessment?: Yes Eye Alignment: Within Functional Limits Ocular Range of Motion: Within Functional Limits Alignment/Gaze Preference: Within Defined Limits Tracking/Visual Pursuits: Able to track stimulus in all quads without difficulty Convergence: Impaired (comment)   Perception     Praxis      Cognition Arousal/Alertness: Awake/alert Behavior During Therapy: WFL for tasks assessed/performed Overall Cognitive Status: Impaired/Different from baseline Area of Impairment: Safety/judgement;Problem solving;Memory                     Memory: Decreased short-term memory Following Commands: Follows one step commands consistently Safety/Judgement: Decreased awareness of safety;Decreased awareness of deficits Awareness: Emergent Problem Solving: Requires verbal cues;Requires tactile cues General Comments: Very motivated to particiapte in therapy. Poor awareness of deficits. Difficulty sequencing and benefiting from cues        Exercises Exercises: Other  exercises Hand Exercises Wrist Flexion: AROM;Right;5 reps Wrist Extension: AROM;Right;5 reps;Seated Digit Composite Flexion: Right;AROM;5 reps;Seated Composite Extension:  Right;AROM;Seated Other Exercises Other Exercises: Simulated cleaning task; placing hand on wash clothe and then sliding wash clothe to each corner of table counter clockwise; x5; hand over hand to faicltiate normalized movement patterns. Other Exercises: Grasp and release task with wash clothe. Max cues for sequencing and hand over hand for normalized movement; x5; seated Other Exercises: Grasp and release task with medicine cup. Max cues for sequencing and hand over hand for normalized movement; x5; seated   Shoulder Instructions       General Comments      Pertinent Vitals/ Pain       Pain Assessment: Faces Faces Pain Scale: Hurts little more Pain Location: back Pain Descriptors / Indicators: Discomfort;Grimacing Pain Intervention(s): Monitored during session;Limited activity within patient's tolerance;Repositioned  Home Living                                          Prior Functioning/Environment              Frequency  Min 2X/week        Progress Toward Goals  OT Goals(current goals can now be found in the care plan section)  Progress towards OT goals: Progressing toward goals  Acute Rehab OT Goals Patient Stated Goal: to return home walking and see my dog Christian Russell OT Goal Formulation: With patient Time For Goal Achievement: 06/22/21 Potential to Achieve Goals: Good ADL Goals Pt Will Perform Grooming: sitting;with set-up Pt Will Perform Upper Body Bathing: with min guard assist;sitting Pt Will Transfer to Toilet: with min assist;stand pivot transfer Additional ADL Goal #1: Pt will complete bed mobility min (A) as precursor to adls  Plan Discharge plan remains appropriate;Frequency remains appropriate    Co-evaluation    PT/OT/SLP Co-Evaluation/Treatment: Yes Reason for Co-Treatment: For patient/therapist safety;To address functional/ADL transfers   OT goals addressed during session: ADL's and self-care      AM-PAC OT "6 Clicks" Daily  Activity     Outcome Measure   Help from another person eating meals?: A Little Help from another person taking care of personal grooming?: A Little Help from another person toileting, which includes using toliet, bedpan, or urinal?: A Lot Help from another person bathing (including washing, rinsing, drying)?: A Lot Help from another person to put on and taking off regular upper body clothing?: A Little Help from another person to put on and taking off regular lower body clothing?: A Lot 6 Click Score: 15    End of Session Equipment Utilized During Treatment: Gait belt;Other (comment) (Hemiwalker)  OT Visit Diagnosis: Unsteadiness on feet (R26.81);Muscle weakness (generalized) (M62.81);Hemiplegia and hemiparesis Hemiplegia - Right/Left: Right Hemiplegia - dominant/non-dominant: Dominant Hemiplegia - caused by: Cerebral infarction   Activity Tolerance Patient tolerated treatment well   Patient Left in chair;with call bell/phone within reach;with chair alarm set   Nurse Communication Mobility status (IV site red)        Time: 0254-2706 OT Time Calculation (min): 38 min  Charges: OT General Charges $OT Visit: 1 Visit OT Treatments $Therapeutic Activity: 23-37 mins  Hensley Treat MSOT, OTR/L Acute Rehab Pager: [phone] Office: [phone]  Theodoro Grist Delania Ferg 06/14/2021, 3:58 PM

## 2021-06-14 NOTE — Plan of Care (Signed)

## 2021-06-14 NOTE — Progress Notes (Signed)
Physical Therapy Treatment Patient Details Name: Christian Russell MRN: [id number] DOB: 11/02/1970 Today's Date: 06/14/2021   History of Present Illness 50 y.o. 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 PMH: Depression, HLD.    PT Comments    Pt received in supine, agreeable to therapy session and motivated to progress standing/gait tolerance this session. Pt needing physical assist +2 for gait due to decreased motor control on RLE needing manual assist for safe foot placement and to prevent buckling/supination of ankle. May consider AFO/KI in future sessions for pt safety with standing/stepping and noted foot drop/R ankle weakness. Pt making good progress with transfers, able to stand and pivot with minA but with pt decreased insight into deficits and decreased R sensation benefits with +2 staff assist for safety with mobility tasks. Pt continues to benefit from PT services to progress toward functional mobility goals.   Recommendations for follow up therapy are one component of a multi-disciplinary discharge planning process, led by the attending physician.  Recommendations may be updated based on patient status, additional functional criteria and insurance authorization.  Follow Up Recommendations  CIR     Equipment Recommendations  Other (comment) (TBD post-acute)    Recommendations for Other Services       Precautions / Restrictions Precautions Precautions: Fall Precaution Comments: BP goal <220/120 Restrictions Weight Bearing Restrictions: No     Mobility  Bed Mobility Overal bed mobility: Needs Assistance Bed Mobility: Supine to Sit Rolling: Min guard         General bed mobility comments: Min Guard A for safety    Transfers Overall transfer level: Needs assistance Equipment used: Right platform walker Transfers: Sit to/from [employer] Transfers Sit to Stand: Min assist Stand  pivot transfers: Min assist       General transfer comment: Min A for power up into standing and balance, cues for safety and slowing down/positioning before stepping.  Ambulation/Gait Ambulation/Gait assistance: Mod assist;+2 physical assistance Gait Distance (Feet): 23 Feet Assistive device: Right platform walker Gait Pattern/deviations: Step-to pattern;Shuffle;Decreased weight shift to right;Decreased step length - right;Decreased dorsiflexion - right   Gait velocity interpretation: <1.31 ft/sec, indicative of household ambulator General Gait Details: needs modA at pt RLE for safe foot placement, and with R ankle ace wrapped for foot drop prevention (although pt tending to supinate more with LE wrapped in this manner), pt tending to push more through LUE and needs reminder to engage R forearm/shoulder complex for upright posture and stability. Pt buckles without R knee support so needs consistent hands-on assist for each step, second therapist assisting on L side for safe posture and stability with gait belt   Stairs             Wheelchair Mobility    Modified Rankin (Stroke Patients Only) Modified Rankin (Stroke Patients Only) Pre-Morbid Rankin Score: No symptoms Modified Rankin: Moderately severe disability     Balance Overall balance assessment: Needs assistance Sitting-balance support: Single extremity supported;Feet supported Sitting balance-Leahy Scale: Fair     Standing balance support: Bilateral upper extremity supported;During functional activity Standing balance-Leahy Scale: Poor Standing balance comment: requiring physical A for maintaining standing at AD                            Cognition Arousal/Alertness: Awake/alert Behavior During Therapy: [facility] for tasks assessed/performed;Impulsive Overall Cognitive Status: Impaired/Different from baseline Area of  Impairment: Safety/judgement;Problem solving;Memory                     Memory:  Decreased short-term memory Following Commands: Follows one step commands consistently Safety/Judgement: Decreased awareness of safety;Decreased awareness of deficits Awareness: Emergent Problem Solving: Requires verbal cues;Requires tactile cues General Comments: Very motivated to participate in therapy. Poor awareness of deficits. Difficulty sequencing and benefiting from cues. He has difficulty with more than 1-step cues.      Exercises Hand Exercises Wrist Flexion: AROM;Right;5 reps Wrist Extension: AROM;Right;5 reps;Seated Digit Composite Flexion: Right;AROM;5 reps;Seated Composite Extension: Right;AROM;Seated Other Exercises Other Exercises: seated RLE AROM: hip flexion, LAQ x10 reps ea Other Exercises: ankle pumps on RLE AROM x5 reps focusing on improved dorsiflexion (more PF, minimal DF achieved) Other Exercises: Grasp and release task with medicine cup. Max cues for sequencing and hand over hand for normalized movement; x5; seated    General Comments        Pertinent Vitals/Pain Pain Assessment: Faces Faces Pain Scale: Hurts little more Pain Location: back Pain Descriptors / Indicators: Discomfort;Grimacing Pain Intervention(s): Monitored during session;Repositioned;RN gave pain meds during session    Home Living                      Prior Function            PT Goals (current goals can now be found in the care plan section) Acute Rehab PT Goals Patient Stated Goal: to return home walking and see my dog Patches PT Goal Formulation: With patient Time For Goal Achievement: 06/22/21 Progress towards PT goals: Progressing toward goals    Frequency    Min 4X/week      PT Plan Current plan remains appropriate    Co-evaluation PT/OT/SLP Co-Evaluation/Treatment: Yes Reason for Co-Treatment: For patient/therapist safety;To address functional/ADL transfers PT goals addressed during session: Mobility/safety with mobility;Balance;Proper use of  DME;Strengthening/ROM OT goals addressed during session: ADL's and self-care      AM-PAC PT "6 Clicks" Mobility   Outcome Measure  Help needed turning from your back to your side while in a flat bed without using bedrails?: A Little Help needed moving from lying on your back to sitting on the side of a flat bed without using bedrails?: A Little Help needed moving to and from a bed to a chair (including a wheelchair)?: A Little Help needed standing up from a chair using your arms (e.g., wheelchair or bedside chair)?: A Lot Help needed to walk in hospital room?: A Lot Help needed climbing 3-5 steps with a railing? : Total 6 Click Score: 14    End of Session Equipment Utilized During Treatment: Gait belt Activity Tolerance: Patient tolerated treatment well Patient left: in bed;with call bell/phone within reach;Other (comment) (in care of OT seated EOB) Nurse Communication: Mobility status;Other (comment) (pt has note over bed stating to take pills with puree, per RN he has been swallowing them with thin liquids today and not coughing) PT Visit Diagnosis: Unsteadiness on feet (R26.81);Muscle weakness (generalized) (M62.81);Other abnormalities of gait and mobility (R26.89);Hemiplegia and hemiparesis Hemiplegia - Right/Left: Right Hemiplegia - dominant/non-dominant: Dominant Hemiplegia - caused by: Cerebral infarction     Time: 1435-1458 PT Time Calculation (min) (ACUTE ONLY): 23 min  Charges:  $Gait Training: 8-22 mins                     Jasmina Gendron P., PTA Acute Rehabilitation Services Pager: [phone] Office: [phone]    Dorathy Kinsman  Marleah Beever 06/14/2021, 4:35 PM

## 2021-06-15 MED ORDER — OXYCODONE HCL 5 MG PO TABS
10.0000 mg | ORAL_TABLET | Freq: Four times a day (QID) | ORAL | Status: DC | PRN
  Administered 2021-06-15 – 2021-06-16 (×4): 10 mg via ORAL
  Filled 2021-06-15 (×4): qty 2

## 2021-06-15 NOTE — Progress Notes (Signed)
Seen and agreed with Dr. Jomarie Longs no from yesterday. No changes overnight still awaiting inpatient rehab placement.

## 2021-06-15 NOTE — Progress Notes (Signed)
OT Cancellation Note  Patient Details Name: ANANT AGARD MRN: [id number] DOB: 20-Jul-1971   Cancelled Treatment:    Reason Eval/Treat Not Completed: Patient declined, no reason specified;Pain limiting ability to participate. Pt refused therapy today x2. At 11am pt had not been medicated yet and reported 10/10 pain. Pt medicated at 12pm. OT/PT followed up at 2p and pt continued to refuse stating that he would not work with therapy until he got stronger pain medication. OT will follow up as time allows.   Seana Underwood H., OTR/L Acute Rehabilitation  Joncarlo Friberg Elane Bing Plume 06/15/2021, 2:06 PM

## 2021-06-15 NOTE — Progress Notes (Signed)
Inpatient Rehab Admissions Coordinator:   I do not have a bed for this Pt. On CIR today. I will continue to follow for potential admit pending bed availability.  Adaleigh Warf, MS, CCC-SLP Rehab Admissions Coordinator  [phone] (celll) [phone] (office)  
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
## 2021-06-15 NOTE — Progress Notes (Signed)
PT Cancellation Note  Patient Details Name: Christian Russell MRN: [id number] DOB: 09-27-1970   Cancelled Treatment:    Reason Eval/Treat Not Completed: Other (comment) Attempted to work with this pt twice today. On first attempt, pt reporting "10/10" left lower back pain with no alleviating factors. I then asked the RN to medicate him 1 hour prior to my next attempt. On the second attempt, pt continues to endorse "10/10" pain and continues to state the medication has no effect on him. He refused to participate in any therapy whatsoever. Discussed with RN.   Lillia Pauls, PT, DPT Acute Rehabilitation Services Pager [phone] Office [phone]    Norval Morton 06/15/2021, 2:15 PM

## 2021-06-16 ENCOUNTER — Inpatient Hospital Stay (HOSPITAL_COMMUNITY)
Admission: RE | Admit: 2021-06-16 | Discharge: 2021-06-30 | DRG: 057 | Disposition: A | Discharge status: Home / Self Care | Payer: BC Managed Care – PPO | Source: Intra-hospital | Attending: Physical Medicine and Rehabilitation | Admitting: Physical Medicine and Rehabilitation

## 2021-06-16 ENCOUNTER — Encounter (HOSPITAL_COMMUNITY): Payer: Self-pay | Admitting: Physical Medicine and Rehabilitation

## 2021-06-16 ENCOUNTER — Other Ambulatory Visit: Payer: Self-pay

## 2021-06-16 DIAGNOSIS — Z8249 Family history of ischemic heart disease and other diseases of the circulatory system: Secondary | ICD-10-CM

## 2021-06-16 DIAGNOSIS — E663 Overweight: Secondary | ICD-10-CM | POA: Diagnosis present

## 2021-06-16 DIAGNOSIS — G4700 Insomnia, unspecified: Secondary | ICD-10-CM | POA: Diagnosis present

## 2021-06-16 DIAGNOSIS — Z6826 Body mass index (BMI) 26.0-26.9, adult: Secondary | ICD-10-CM | POA: Diagnosis not present

## 2021-06-16 DIAGNOSIS — I169 Hypertensive crisis, unspecified: Secondary | ICD-10-CM | POA: Diagnosis present

## 2021-06-16 DIAGNOSIS — G2581 Restless legs syndrome: Secondary | ICD-10-CM | POA: Diagnosis present

## 2021-06-16 DIAGNOSIS — E785 Hyperlipidemia, unspecified: Secondary | ICD-10-CM | POA: Diagnosis present

## 2021-06-16 DIAGNOSIS — K5900 Constipation, unspecified: Secondary | ICD-10-CM | POA: Diagnosis present

## 2021-06-16 DIAGNOSIS — R531 Weakness: Secondary | ICD-10-CM | POA: Diagnosis present

## 2021-06-16 DIAGNOSIS — I619 Nontraumatic intracerebral hemorrhage, unspecified: Secondary | ICD-10-CM | POA: Diagnosis present

## 2021-06-16 DIAGNOSIS — M62838 Other muscle spasm: Secondary | ICD-10-CM | POA: Diagnosis present

## 2021-06-16 DIAGNOSIS — Z833 Family history of diabetes mellitus: Secondary | ICD-10-CM

## 2021-06-16 DIAGNOSIS — I10 Essential (primary) hypertension: Secondary | ICD-10-CM | POA: Diagnosis present

## 2021-06-16 DIAGNOSIS — Z23 Encounter for immunization: Secondary | ICD-10-CM

## 2021-06-16 DIAGNOSIS — R739 Hyperglycemia, unspecified: Secondary | ICD-10-CM | POA: Diagnosis present

## 2021-06-16 DIAGNOSIS — I69222 Dysarthria following other nontraumatic intracranial hemorrhage: Secondary | ICD-10-CM

## 2021-06-16 DIAGNOSIS — F5104 Psychophysiologic insomnia: Secondary | ICD-10-CM

## 2021-06-16 DIAGNOSIS — G8929 Other chronic pain: Secondary | ICD-10-CM | POA: Diagnosis present

## 2021-06-16 DIAGNOSIS — I610 Nontraumatic intracerebral hemorrhage in hemisphere, subcortical: Secondary | ICD-10-CM | POA: Diagnosis not present

## 2021-06-16 DIAGNOSIS — Z79899 Other long term (current) drug therapy: Secondary | ICD-10-CM

## 2021-06-16 DIAGNOSIS — I69251 Hemiplegia and hemiparesis following other nontraumatic intracranial hemorrhage affecting right dominant side: Principal | ICD-10-CM

## 2021-06-16 DIAGNOSIS — R52 Pain, unspecified: Secondary | ICD-10-CM

## 2021-06-16 MED ORDER — AMLODIPINE BESYLATE 5 MG PO TABS
5.0000 mg | ORAL_TABLET | Freq: Every day | ORAL | Status: DC
  Administered 2021-06-17: 5 mg via ORAL
  Filled 2021-06-16: qty 1

## 2021-06-16 MED ORDER — BLOOD PRESSURE CONTROL BOOK
Freq: Once | Status: AC
  Filled 2021-06-16: qty 1

## 2021-06-16 MED ORDER — METHOCARBAMOL 750 MG PO TABS
750.0000 mg | ORAL_TABLET | Freq: Four times a day (QID) | ORAL | Status: DC | PRN
  Administered 2021-06-17 – 2021-06-30 (×24): 750 mg via ORAL
  Filled 2021-06-16 (×25): qty 1

## 2021-06-16 MED ORDER — TEMAZEPAM 15 MG PO CAPS
15.0000 mg | ORAL_CAPSULE | Freq: Every day | ORAL | Status: DC
  Administered 2021-06-16 – 2021-06-29 (×14): 15 mg via ORAL
  Filled 2021-06-16 (×14): qty 1

## 2021-06-16 MED ORDER — SENNOSIDES-DOCUSATE SODIUM 8.6-50 MG PO TABS
1.0000 | ORAL_TABLET | Freq: Two times a day (BID) | ORAL | Status: DC
  Administered 2021-06-17 – 2021-06-30 (×12): 1 via ORAL
  Filled 2021-06-16 (×24): qty 1

## 2021-06-16 MED ORDER — OXYCODONE HCL 5 MG PO TABS
10.0000 mg | ORAL_TABLET | Freq: Four times a day (QID) | ORAL | Status: DC | PRN
  Administered 2021-06-16 – 2021-06-17 (×3): 10 mg via ORAL
  Filled 2021-06-16 (×3): qty 2

## 2021-06-16 MED ORDER — OXYCODONE HCL 10 MG PO TABS: 10.0000 mg | ORAL_TABLET | Freq: Four times a day (QID) | ORAL | 0 refills | Status: DC | PRN

## 2021-06-16 MED ORDER — INFLUENZA VAC SPLIT QUAD 0.5 ML IM SUSY
0.5000 mL | PREFILLED_SYRINGE | INTRAMUSCULAR | Status: AC
  Administered 2021-06-17: 0.5 mL via INTRAMUSCULAR
  Filled 2021-06-16: qty 0.5

## 2021-06-16 MED ORDER — PANTOPRAZOLE SODIUM 40 MG PO TBEC
40.0000 mg | DELAYED_RELEASE_TABLET | Freq: Every day | ORAL | Status: DC
  Administered 2021-06-16 – 2021-06-29 (×14): 40 mg via ORAL
  Filled 2021-06-16 (×14): qty 1

## 2021-06-16 MED ORDER — MELATONIN 3 MG PO TABS
3.0000 mg | ORAL_TABLET | Freq: Every day | ORAL | Status: DC
  Administered 2021-06-16 – 2021-06-29 (×14): 3 mg via ORAL
  Filled 2021-06-16 (×14): qty 1

## 2021-06-16 MED ORDER — ACETAMINOPHEN 325 MG PO TABS
325.0000 mg | ORAL_TABLET | ORAL | Status: DC | PRN
  Administered 2021-06-17 – 2021-06-30 (×13): 650 mg via ORAL
  Filled 2021-06-16 (×13): qty 2

## 2021-06-16 MED ORDER — ATORVASTATIN CALCIUM 80 MG PO TABS
80.0000 mg | ORAL_TABLET | Freq: Every day | ORAL | Status: DC
  Administered 2021-06-17 – 2021-06-30 (×14): 80 mg via ORAL
  Filled 2021-06-16 (×14): qty 1

## 2021-06-16 NOTE — Progress Notes (Signed)
Patient to be discharged to CIR. Report called to Inpatient Rehab by Kelle Darting RN. Patient transported via bed to Inpatient Rehab Room [phone]

## 2021-06-16 NOTE — Discharge Summary (Signed)
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 duration of the COVID-19 declaration under Section 564(b)(1) of the Act, 21 U.S.C. section 360bbb-3(b)(1), unless the authorization is terminated or revoked.  Performed at [facility], [address], [place], [place] [address]   MRSA Next Gen by PCR, Nasal     Status: None   Collection Time: 06/07/21  1:33 AM   Specimen: Nasal Mucosa; Nasal Swab  Result Value Ref Range Status   MRSA by PCR Next Gen NOT DETECTED NOT DETECTED Final    Comment: (NOTE) The GeneXpert MRSA Assay (FDA approved for NASAL specimens only), is one component of a comprehensive MRSA colonization surveillance program. It is not intended to diagnose MRSA infection nor to guide or monitor treatment for MRSA infections. Test performance is not FDA approved in patients less than 18 years old. Performed at [facility], [address], [place], [place] [address]      Labs: BNP (last 3 results) No results  for input(s): BNP in the  last 8760 hours. Basic Metabolic Panel: Recent Labs  Lab 06/10/21 0309  NA 138  K 3.7  CL 106  CO2 22  GLUCOSE 113*  BUN 10  CREATININE 0.68  CALCIUM 9.3   Liver Function Tests: No results for input(s): AST, ALT, ALKPHOS, BILITOT, PROT, ALBUMIN in the last 168 hours. No results for input(s): LIPASE, AMYLASE in the last 168 hours. No results for input(s): AMMONIA in the last 168 hours. CBC: Recent Labs  Lab 06/10/21 0309  WBC 7.7  HGB 14.7  HCT 42.6  MCV 99.3  PLT 303   Cardiac Enzymes: No results for input(s): CKTOTAL, CKMB, CKMBINDEX, TROPONINI in the last 168 hours. BNP: Invalid input(s): POCBNP CBG: No results for input(s): GLUCAP in the last 168 hours. D-Dimer No results for input(s): DDIMER in the last 72 hours. Hgb A1c No results for input(s): HGBA1C in the last 72 hours. Lipid Profile No results for input(s): CHOL, HDL, LDLCALC, TRIG, CHOLHDL, LDLDIRECT in the last 72 hours. Thyroid function studies No results for input(s): TSH, T4TOTAL, T3FREE, THYROIDAB in the last 72 hours.  Invalid input(s): FREET3 Anemia work up No results for input(s): VITAMINB12, FOLATE, FERRITIN, TIBC, IRON, RETICCTPCT in the last 72 hours. Urinalysis    Component Value Date/Time   COLORURINE YELLOW 06/06/2021 2251   APPEARANCEUR CLEAR 06/06/2021 2251   LABSPEC 1.019 06/06/2021 2251   PHURINE 5.0 06/06/2021 2251   GLUCOSEU NEGATIVE 06/06/2021 2251   HGBUR NEGATIVE 06/06/2021 2251   BILIRUBINUR NEGATIVE 06/06/2021 2251   KETONESUR NEGATIVE 06/06/2021 2251   PROTEINUR NEGATIVE 06/06/2021 2251   NITRITE NEGATIVE 06/06/2021 2251   LEUKOCYTESUR NEGATIVE 06/06/2021 2251   Sepsis Labs Invalid input(s): PROCALCITONIN,  WBC,  LACTICIDVEN Microbiology Recent Results (from the past 240 hour(s))  Resp Panel by RT-PCR (Flu A&B, Covid) Nasopharyngeal Swab     Status: None   Collection Time: 06/06/21 10:33 PM   Specimen: Nasopharyngeal Swab; Nasopharyngeal(NP) swabs in vial transport  medium  Result Value Ref Range Status   SARS Coronavirus 2 by RT PCR NEGATIVE NEGATIVE Final    Comment: (NOTE) SARS-CoV-2 target nucleic acids are NOT DETECTED.  The SARS-CoV-2 RNA is generally detectable in upper respiratory specimens during the acute phase of infection. The lowest concentration of SARS-CoV-2 viral copies this assay can detect is 138 copies/mL. A negative result does not preclude SARS-Cov-2 infection and should not be used as the sole basis for treatment or other patient management decisions. A negative result may occur with  improper specimen collection/handling, submission of specimen other than nasopharyngeal swab, presence of viral mutation(s) within the areas targeted by this assay, and inadequate number of viral copies(<138 copies/mL). A negative result must be combined with clinical observations, patient history, and epidemiological information. The expected result is Negative.  Fact Sheet for Patients:  BloggerCourse.com  Fact Sheet for Healthcare Providers:  SeriousBroker.it  This test is no t yet approved or cleared by the [place] FDA and  has been authorized for detection and/or diagnosis of SARS-CoV-2 by FDA under an Emergency Use Authorization (EUA). This EUA will remain  in effect (meaning this test can be used) for the duration of the COVID-19 declaration under Section 564(b)(1) of the Act, 21 U.S.C.section 360bbb-3(b)(1), unless the authorization is terminated  or revoked sooner.       Influenza A by PCR NEGATIVE NEGATIVE Final   Influenza B by PCR NEGATIVE NEGATIVE Final    Comment: (NOTE) The Xpert Xpress SARS-CoV-2/FLU/RSV plus assay  is intended as an aid in the diagnosis of influenza from Nasopharyngeal swab specimens and should not be used as a sole basis for treatment. Nasal washings and aspirates are unacceptable for Xpert Xpress SARS-CoV-2/FLU/RSV testing.  Fact Sheet for  Patients: BloggerCourse.com  Fact Sheet for Healthcare Providers: SeriousBroker.it  This test is not yet approved or cleared by the [place] FDA and has been authorized for detection and/or diagnosis of SARS-CoV-2 by FDA under an Emergency Use Authorization (EUA). This EUA will remain in effect (meaning this test can be used) for the duration of the COVID-19 declaration under Section 564(b)(1) of the Act, 21 U.S.C. section 360bbb-3(b)(1), unless the authorization is terminated or revoked.  Performed at [facility], [address] [place], [place] [address]   MRSA Next Gen by PCR, Nasal     Status: None   Collection Time: 06/07/21  1:33 AM   Specimen: Nasal Mucosa; Nasal Swab  Result Value Ref Range Status   MRSA by PCR Next Gen NOT DETECTED NOT DETECTED Final    Comment: (NOTE) The GeneXpert MRSA Assay (FDA approved for NASAL specimens only), is one component of a comprehensive MRSA colonization surveillance program. It is not intended to diagnose MRSA infection nor to guide or monitor treatment for MRSA infections. Test performance is not FDA approved in patients less than 10 years old. Performed at [facility], [address] [place], [place] [address]    SIGNED:   Marinda Elk, MD  [facility] 06/16/2021, 11:39 AM Pager   If 7PM-7AM, please contact night-coverage [email or web address] Password [email or web address]
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
## 2021-06-16 NOTE — Progress Notes (Signed)
IP rehab admissions - I met with patient this am.  He is agreeable to CIR admission today.  Bed available and will admit to inpatient rehab today.  Call for questions.  [phone]

## 2021-06-16 NOTE — Progress Notes (Signed)
INPATIENT REHABILITATION ADMISSION NOTE   Arrival Method:bed     Mental Orientation:alert   Assessment:done   Skin:done   IV'S:   Pain:   Tubes and Drains:   Safety Measures:   Vital Signs:   Height and Weight:   Rehab Orientation:   Family:    Notes:

## 2021-06-16 NOTE — TOC Transition Note (Signed)
Transition of Care [facility]) - CM/SW Discharge Note   Patient Details  Name: Christian Russell MRN: [id number] Date of Birth: Jul 16, 1971  [facility] CM/SW Contact:  Kermit Balo, RN Phone Number: 06/16/2021, 11:56 AM   Clinical Narrative:    Patient is discharging to CIR today. CM signing off.    Final next level of care: IP Rehab Facility Barriers to Discharge: No Barriers Identified   Patient Goals and CMS Choice        Discharge Placement                       Discharge Plan and Services                                     Social Determinants of Health (SDOH) Interventions     Readmission Risk Interventions No flowsheet data found.

## 2021-06-16 NOTE — Progress Notes (Signed)
Physical Therapy Treatment Patient Details Name: Christian Russell MRN: [id number] DOB: 07-02-1971 Today's Date: 06/16/2021   History of Present Illness 50 y.o. 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 PMH: Depression, HLD.    PT Comments    Pt received in bed, agreeable to therapy session and with excellent participation and good tolerance for gait training with +2 modA for safety and step sequencing with Rt PF RW. OT present for co-tx due to pt multidisciplinary therapy needs. Pt with continued decreased R awareness/inattention and needs multimodal cues for safe step sequencing and unable to dual task this date. Pt c/o increased LLE soreness and fatigued more quickly today with gait trial, needing seated break and would benefit from chair follow next session. Pt continues to benefit from PT services to progress toward functional mobility goals.   Recommendations for follow up therapy are one component of a multi-disciplinary discharge planning process, led by the attending physician.  Recommendations may be updated based on patient status, additional functional criteria and insurance authorization.  Follow Up Recommendations  CIR     Equipment Recommendations  Other (comment) (TBD post-acute)    Recommendations for Other Services       Precautions / Restrictions Precautions Precautions: Fall Precaution Comments: BP goal <220/120 Restrictions Weight Bearing Restrictions: No     Mobility  Bed Mobility Overal bed mobility: Needs Assistance Bed Mobility: Sit to Supine       Sit to supine: Supervision   General bed mobility comments: cues for technique for self-assist, bed rail use    Transfers Overall transfer level: Needs assistance Equipment used: Right platform walker Transfers: Sit to/from Stand Sit to Stand: Min assist;+2 safety/equipment         General transfer comment: Min A for power up into standing and  balance, cues for safety and slowing down/positioning before stepping, pt also needs reminder to loosen RUE from platform prior to sitting  Ambulation/Gait Ambulation/Gait assistance: Mod assist;+2 physical assistance Gait Distance (Feet): 15 Feet (69ft, 88ft with seated break EOB) Assistive device: Right platform walker Gait Pattern/deviations: Step-to pattern;Shuffle;Decreased weight shift to right;Decreased step length - right;Decreased dorsiflexion - right     General Gait Details: needs mod to maxA at pt RLE for safe foot placement and awareness, pt trying to step twice with LLE while leaving RLE behind him, causing LOB. Pt tending to push more through LUE and needs reminder to engage R forearm/shoulder complex for upright posture and stability. Pt buckles without R knee support so needs consistent hands-on assist for each step, second therapist assisting on L side for safe posture and stability with gait belt      Modified Rankin (Stroke Patients Only) Modified Rankin (Stroke Patients Only) Pre-Morbid Rankin Score: No symptoms Modified Rankin: Moderately severe disability     Balance Overall balance assessment: Needs assistance Sitting-balance support: Single extremity supported;Feet supported Sitting balance-Leahy Scale: Fair Sitting balance - Comments: donned socks with Supervision for safety with posture while seated EOB   Standing balance support: Bilateral upper extremity supported;During functional activity Standing balance-Leahy Scale: Poor Standing balance comment: requiring physical A for maintaining standing at AD, needs +2 for dynamic standing tasks                            Cognition Arousal/Alertness: Awake/alert Behavior During Therapy: [facility] for tasks assessed/performed;Impulsive Overall Cognitive Status: Impaired/Different from baseline Area of Impairment: Safety/judgement;Problem  solving;Memory                     Memory: Decreased  short-term memory Following Commands: Follows one step commands consistently Safety/Judgement: Decreased awareness of safety;Decreased awareness of deficits Awareness: Emergent Problem Solving: Requires verbal cues;Requires tactile cues;Difficulty sequencing General Comments: Very motivated to participate in therapy. Poor awareness of deficits. Difficulty sequencing and he benefits from max cues for safety with transfers and stepping. He has difficulty with more than 1-step cues.      Exercises General Exercises - Lower Extremity Ankle Circles/Pumps: PROM;AROM;Both;Seated (mostly PROM on RLE) Long Arc Quad: 10 reps;Seated;AROM;Both Other Exercises Other Exercises: seated RUE AA/PROM D1/D2 pattern x5 reps    General Comments        Pertinent Vitals/Pain Pain Assessment: Faces Faces Pain Scale: Hurts little more Pain Location: LLE muscle soreness around thigh/groin Pain Descriptors / Indicators: Discomfort;Grimacing;Sore Pain Intervention(s): Limited activity within patient's tolerance;Monitored during session;Repositioned     PT Goals (current goals can now be found in the care plan section) Acute Rehab PT Goals Patient Stated Goal: to return home walking and see my dog Patches PT Goal Formulation: With patient Time For Goal Achievement: 06/22/21 Progress towards PT goals: Progressing toward goals    Frequency    Min 4X/week      PT Plan Current plan remains appropriate    Co-evaluation PT/OT/SLP Co-Evaluation/Treatment: Yes Reason for Co-Treatment: Complexity of the patient's impairments (multi-system involvement);Necessary to address cognition/behavior during functional activity;To address functional/ADL transfers PT goals addressed during session: Mobility/safety with mobility;Balance;Proper use of DME;Strengthening/ROM        AM-PAC PT "6 Clicks" Mobility   Outcome Measure  Help needed turning from your back to your side while in a flat bed without using  bedrails?: A Little Help needed moving from lying on your back to sitting on the side of a flat bed without using bedrails?: A Little Help needed moving to and from a bed to a chair (including a wheelchair)?: A Little Help needed standing up from a chair using your arms (e.g., wheelchair or bedside chair)?: A Lot Help needed to walk in hospital room?: A Lot Help needed climbing 3-5 steps with a railing? : Total 6 Click Score: 14    End of Session Equipment Utilized During Treatment: Gait belt Activity Tolerance: Patient tolerated treatment well Patient left: with call bell/phone within reach;in bed;with bed alarm set Nurse Communication: Mobility status PT Visit Diagnosis: Unsteadiness on feet (R26.81);Muscle weakness (generalized) (M62.81);Other abnormalities of gait and mobility (R26.89);Hemiplegia and hemiparesis Hemiplegia - Right/Left: Right Hemiplegia - dominant/non-dominant: Dominant Hemiplegia - caused by: Cerebral infarction     Time: 1430-1457 PT Time Calculation (min) (ACUTE ONLY): 27 min  Charges:  $Gait Training: 8-22 mins                     Emari Demmer P., PTA Acute Rehabilitation Services Pager: [phone] Office: [phone]    Angus Palms 06/16/2021, 3:38 PM

## 2021-06-16 NOTE — Progress Notes (Signed)
Occupational Therapy Treatment Patient Details Name: Christian Russell MRN: [id number] DOB: 1971/01/15 Today's Date: 06/16/2021   History of present illness 50 y.o. 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 PMH: Depression, HLD.   OT comments  Pt making good progress with OT goals this session. He completed toileting, prolonged ambulation working on activity tolerance, and basic ADL's this session. Pt requiring min-mod A +2 for all functional mobility, mainly to assist with balance and moving his RLE. Additionally pt participated in exercises listed below. OT will continue to follow acutely.    Recommendations for follow up therapy are one component of a multi-disciplinary discharge planning process, led by the attending physician.  Recommendations may be updated based on patient status, additional functional criteria and insurance authorization.    Follow Up Recommendations  CIR    Equipment Recommendations  3 in 1 bedside commode    Recommendations for Other Services      Precautions / Restrictions Precautions Precautions: Fall Precaution Comments: BP goal <220/120 Restrictions Weight Bearing Restrictions: No       Mobility Bed Mobility Overal bed mobility: Needs Assistance Bed Mobility: Sit to Supine       Sit to supine: Supervision   General bed mobility comments: cues for technique for self-assist, bed rail use    Transfers Overall transfer level: Needs assistance Equipment used: Right platform walker Transfers: Sit to/from Stand Sit to Stand: Min assist;+2 safety/equipment         General transfer comment: Min A for power up into standing and balance, cues for safety and slowing down/positioning before stepping, pt also needs reminder to loosen RUE from platform prior to sitting    Balance Overall balance assessment: Needs assistance Sitting-balance support: Single extremity supported;Feet  supported Sitting balance-Leahy Scale: Fair Sitting balance - Comments: donned socks with Supervision for safety with posture while seated EOB   Standing balance support: Bilateral upper extremity supported;During functional activity Standing balance-Leahy Scale: Poor Standing balance comment: requiring physical A for maintaining standing at AD, needs +2 for dynamic standing tasks                           ADL either performed or assessed with clinical judgement   ADL Overall ADL's : Needs assistance/impaired     Grooming: Wash/dry hands;Moderate assistance;Standing Grooming Details (indicate cue type and reason): Pt able to stand on his own, requiring mod A to assist with support and balance to complete task             Lower Body Dressing: Minimal assistance;Sitting/lateral leans Lower Body Dressing Details (indicate cue type and reason): Assist with donning sock on RLE as pt had difficulty holding his RLE up to don the sock Toilet Transfer: Minimal assistance;Stand-pivot Toilet Transfer Details (indicate cue type and reason): Practiced x2 with Min A +2 for assisting balance and RLE movement         Functional mobility during ADLs: Minimal assistance;+2 for physical assistance;+2 for safety/equipment (Platform RW) General ADL Comments: Pt requiring min-mod A +2 in standing to assist with balance and positioning his RLE for stability and movment     Vision       Perception     Praxis      Cognition Arousal/Alertness: Awake/alert Behavior During Therapy: WFL for tasks assessed/performed;Impulsive Overall Cognitive Status: Impaired/Different from baseline Area of Impairment: Safety/judgement;Problem solving;Memory  Memory: Decreased short-term memory Following Commands: Follows one step commands consistently Safety/Judgement: Decreased awareness of safety;Decreased awareness of deficits Awareness: Emergent Problem Solving:  Requires verbal cues;Requires tactile cues;Difficulty sequencing General Comments: Very motivated to participate in therapy. Poor awareness of deficits. Difficulty sequencing and he benefits from max cues for safety with transfers and stepping. He has difficulty with more than 1-step cues.        Exercises Exercises: Other exercises General Exercises - Lower Extremity Ankle Circles/Pumps: PROM;AROM;Both;Seated (mostly PROM on RLE) Long Arc Quad: 10 reps;Seated;AROM;Both Other Exercises Other Exercises: seated RUE AA/PROM D1/D2 pattern x5 reps Other Exercises: AAROM of RUE extending digits and closing hand x10 Other Exercises: AAROM of RUE Reach forward and pulling back   Shoulder Instructions       General Comments VSS on RA    Pertinent Vitals/ Pain       Pain Assessment: Faces Faces Pain Scale: Hurts little more Pain Location: LLE muscle soreness around thigh/groin Pain Descriptors / Indicators: Discomfort;Grimacing;Sore Pain Intervention(s): Limited activity within patient's tolerance;Monitored during session;Repositioned  Home Living                                          Prior Functioning/Environment              Frequency  Min 2X/week        Progress Toward Goals  OT Goals(current goals can now be found in the care plan section)  Progress towards OT goals: Progressing toward goals  Acute Rehab OT Goals Patient Stated Goal: to return home walking and see my dog Patches OT Goal Formulation: With patient Time For Goal Achievement: 06/22/21 Potential to Achieve Goals: Good ADL Goals Pt Will Perform Grooming: sitting;with set-up Pt Will Perform Upper Body Bathing: with min guard assist;sitting Pt Will Transfer to Toilet: with min assist;stand pivot transfer Additional ADL Goal #1: Pt will complete bed mobility min (A) as precursor to adls  Plan Discharge plan remains appropriate;Frequency remains appropriate    Co-evaluation     PT/OT/SLP Co-Evaluation/Treatment: Yes Reason for Co-Treatment: Complexity of the patient's impairments (multi-system involvement);Necessary to address cognition/behavior during functional activity;To address functional/ADL transfers PT goals addressed during session: Mobility/safety with mobility;Balance;Proper use of DME;Strengthening/ROM OT goals addressed during session: ADL's and self-care;Proper use of Adaptive equipment and DME;Strengthening/ROM      AM-PAC OT "6 Clicks" Daily Activity     Outcome Measure   Help from another person eating meals?: A Little Help from another person taking care of personal grooming?: A Little Help from another person toileting, which includes using toliet, bedpan, or urinal?: A Lot Help from another person bathing (including washing, rinsing, drying)?: A Lot Help from another person to put on and taking off regular upper body clothing?: A Little Help from another person to put on and taking off regular lower body clothing?: A Lot 6 Click Score: 15    End of Session Equipment Utilized During Treatment: Gait belt;Other (comment) (platform walker)  OT Visit Diagnosis: Unsteadiness on feet (R26.81);Muscle weakness (generalized) (M62.81);Hemiplegia and hemiparesis Hemiplegia - Right/Left: Right Hemiplegia - dominant/non-dominant: Dominant Hemiplegia - caused by: Cerebral infarction   Activity Tolerance Patient tolerated treatment well   Patient Left in chair;with call bell/phone within reach;with chair alarm set   Nurse Communication Mobility status        Time: 7619-5093 OT Time Calculation (min): 36 min  Charges: OT General  Charges $OT Visit: 1 Visit OT Treatments $Therapeutic Activity: 8-22 mins  Jerelle Virden H., OTR/L Acute Rehabilitation  Fitzgerald Dunne Elane Bing Plume 06/16/2021, 5:41 PM

## 2021-06-17 LAB — CBC WITH DIFFERENTIAL/PLATELET
Abs Immature Granulocytes: 0.04 10*3/uL (ref 0.00–0.07)
Basophils Absolute: 0.1 10*3/uL (ref 0.0–0.1)
Basophils Relative: 1 %
Eosinophils Absolute: 0.2 10*3/uL (ref 0.0–0.5)
Eosinophils Relative: 3 %
HCT: 42.7 % (ref 39.0–52.0)
Hemoglobin: 14.5 g/dL (ref 13.0–17.0)
Immature Granulocytes: 1 %
Lymphocytes Relative: 31 %
Lymphs Abs: 2.7 10*3/uL (ref 0.7–4.0)
MCH: 33.9 pg (ref 26.0–34.0)
MCHC: 34 g/dL (ref 30.0–36.0)
MCV: 99.8 fL (ref 80.0–100.0)
Monocytes Absolute: 1 10*3/uL (ref 0.1–1.0)
Monocytes Relative: 11 %
Neutro Abs: 4.6 10*3/uL (ref 1.7–7.7)
Neutrophils Relative %: 53 %
Platelets: 374 10*3/uL (ref 150–400)
RBC: 4.28 MIL/uL (ref 4.22–5.81)
RDW: 13 % (ref 11.5–15.5)
WBC: 8.5 10*3/uL (ref 4.0–10.5)
nRBC: 0 % (ref 0.0–0.2)

## 2021-06-17 LAB — COMPREHENSIVE METABOLIC PANEL
ALT: 18 U/L (ref 0–44)
AST: 14 U/L — ABNORMAL LOW (ref 15–41)
Albumin: 3.6 g/dL (ref 3.5–5.0)
Alkaline Phosphatase: 58 U/L (ref 38–126)
Anion gap: 6 (ref 5–15)
BUN: 18 mg/dL (ref 6–20)
CO2: 32 mmol/L (ref 22–32)
Calcium: 9.5 mg/dL (ref 8.9–10.3)
Chloride: 101 mmol/L (ref 98–111)
Creatinine, Ser: 0.73 mg/dL (ref 0.61–1.24)
GFR, Estimated: >60 mL/min (ref >60)
Glucose, Bld: 108 mg/dL — ABNORMAL HIGH (ref 70–99)
Potassium: 4.5 mmol/L (ref 3.5–5.1)
Sodium: 139 mmol/L (ref 135–145)
Total Bilirubin: 0.5 mg/dL (ref 0.3–1.2)
Total Protein: 6.7 g/dL (ref 6.5–8.1)

## 2021-06-17 MED ORDER — AMLODIPINE BESYLATE 2.5 MG PO TABS
2.5000 mg | ORAL_TABLET | Freq: Every day | ORAL | Status: DC
  Administered 2021-06-18: 2.5 mg via ORAL
  Filled 2021-06-17: qty 1

## 2021-06-17 MED ORDER — OXYCODONE HCL 5 MG PO TABS
5.0000 mg | ORAL_TABLET | Freq: Four times a day (QID) | ORAL | Status: DC | PRN
  Administered 2021-06-17 – 2021-06-18 (×4): 5 mg via ORAL
  Filled 2021-06-17 (×6): qty 1
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
## 2021-06-17 NOTE — Plan of Care (Signed)
  Problem: Consults Goal: RH STROKE PATIENT EDUCATION Description: See Patient Education module for education specifics  Outcome: Progressing   Problem: RH BOWEL ELIMINATION Goal: RH STG MANAGE BOWEL WITH ASSISTANCE Description: STG Manage Bowel with mod I Assistance. Outcome: Progressing Goal: RH STG MANAGE BOWEL W/MEDICATION W/ASSISTANCE Description: STG Manage Bowel with Medication with  mod I Assistance. Outcome: Progressing   Problem: RH SAFETY Goal: RH STG ADHERE TO SAFETY PRECAUTIONS W/ASSISTANCE/DEVICE Description: STG Adhere to Safety Precautions With cues Assistance/Device. Outcome: Progressing   Problem: RH PAIN MANAGEMENT Goal: RH STG PAIN MANAGED AT OR BELOW PT'S PAIN GOAL Description: At or below level 4 Outcome: Progressing   Problem: RH KNOWLEDGE DEFICIT Goal: RH STG INCREASE KNOWLEDGE OF HYPERTENSION Description: Patient will be able to manage HTn with medications and dietary modifications using handouts and educational resources independently Outcome: Progressing Goal: RH STG INCREASE KNOWLEGDE OF HYPERLIPIDEMIA Outcome: Progressing   

## 2021-06-17 NOTE — Progress Notes (Signed)
PROGRESS NOTE   Subjective/Complaints: Christian Russell c/o right sided weakness He is very motivated to work with therapy Discussed his admission labs Denies insomnia, constipation, pain  ROS: denies insomnia, constipation, pain   Objective:   No results found. Recent Labs    06/17/21 0510  WBC 8.5  HGB 14.5  HCT 42.7  PLT 374   Recent Labs    06/17/21 0510  NA 139  K 4.5  CL 101  CO2 32  GLUCOSE 108*  BUN 18  CREATININE 0.73  CALCIUM 9.5    Intake/Output Summary (Last 24 hours) at 06/17/2021 1036 Last data filed at 06/17/2021 0745 Gross per 24 hour  Intake 540 ml  Output 650 ml  Net -110 ml        Physical Exam: Vital Signs Blood pressure 106/78, pulse 90, temperature 98.2 F (36.8 C), temperature source Oral, resp. rate 16, SpO2 97 %. Gen: no distress, normal appearing HEENT: oral mucosa pink and moist, NCAT Cardio: Reg rate Chest: normal effort, normal rate of breathing Abd: soft, non-distended Ext: no edema Psych: pleasant, normal affect Skin: intact Neuro: Alert and oriented x3 Musculoskeletal: Right sided strength 2/5 EF and EE, 3/5 HF and KE, 0/5 WE and DF   Assessment/Plan: 1. Functional deficits which require 3+ hours per day of interdisciplinary therapy in a comprehensive inpatient rehab setting. Physiatrist is providing close team supervision and 24 hour management of active medical problems listed below. Physiatrist and rehab team continue to assess barriers to discharge/monitor patient progress toward functional and medical goals  Care Tool:  Bathing              Bathing assist       Upper Body Dressing/Undressing Upper body dressing        Upper body assist      Lower Body Dressing/Undressing Lower body dressing            Lower body assist       Toileting Toileting    Toileting assist Assist for toileting: Moderate Assistance - Patient 50 - 74%      Transfers Chair/bed transfer  Transfers assist           Locomotion Ambulation   Ambulation assist              Walk 10 feet activity   Assist           Walk 50 feet activity   Assist           Walk 150 feet activity   Assist           Walk 10 feet on uneven surface  activity   Assist           Wheelchair     Assist               Wheelchair 50 feet with 2 turns activity    Assist            Wheelchair 150 feet activity     Assist          Blood pressure 106/78, pulse 90, temperature 98.2 F (36.8 C), temperature source Oral, resp. rate 16, SpO2 97 %.  Medical Problem List and Plan: 1.  Right side weakness and dysarthria secondary to left basal ganglia hemorrhage secondary to hypertensive crisis             -patient may shower             -ELOS/Goals: 10-14 days, mod I to supervision goals with PT, OT, SLP             -order PRAFO and WHO  2.  Antithrombotics: -DVT/anticoagulation:  Mechanical:  Antiembolism stockings, knee (TED hose) Bilateral lower extremities             -antiplatelet therapy: N/A 3. Pain: Decrease Oxycodone to 5mg  q6H prn. Robaxin-750 milligram every 6 hours as needed muscle spasms 4. Mood/sleep: Team to provide emotional support.  Melatonin 3 mg nightly as well as Restoril.               -antipsychotic agents: N/A             -pt has worked 3rd shift for 20 years and finds it difficult to sleep at night                         -continue above meds and observe 5. Neuropsych: This patient is capable of making decisions on his own behalf. 6. Skin/Wound Care: Routine skin checks 7. Fluids/Electrolytes/Nutrition: Routine in and outs with follow-up chemistries 8.  Hypertension. Monitor with increased mobility  Decrease Norvasc to 2.5mg  9.  Hyperlipidemia.  Lipitor 10.  Urine drug screen positive marijuana.  Provide counseling 11. Hyperglycemia: educated to avoid drinks and foods with  added sugar.   LOS: 1 days A FACE TO FACE EVALUATION WAS PERFORMED  Bellina Tokarczyk 06/17/2021, 10:36 AM

## 2021-06-17 NOTE — Discharge Instructions (Addendum)
Inpatient Rehab Discharge Instructions  Tatsuo Musial Voght Discharge date and time: No discharge date for patient encounter.   Activities/Precautions/ Functional Status: Activity: activity as tolerated Diet: regular diet Wound Care: Routine skin checks Functional status:  ___ No restrictions     ___ Walk up steps independently ___ 24/7 supervision/assistance   ___ Walk up steps with assistance ___ Intermittent supervision/assistance  ___ Bathe/dress independently ___ Walk with walker     __x_ Bathe/dress with assistance ___ Walk Independently    ___ Shower independently ___ Walk with assistance    ___ Shower with assistance ___ No alcohol     ___ Return to work/school ________  Special Instructions:  No driving smoking or alcohol   COMMUNITY REFERRALS UPON DISCHARGE:    Outpatient: PT  OT  SP             Agency:[facility] Phone: [phone]              Appointment Date/Time: WILL CONTACT TO SET UP APPOINTMENTS  Medical Equipment/Items Ordered: WHEELCHAIR HAS BEDSIDE COMMODE AND TUB SEAT WILL GET [facility] ON OWN                                                 Agency/Supplier: [facility]  [phone]  My questions have been answered and I understand these instructions. I will adhere to these goals and the provided educational materials after my discharge from the hospital.  Patient/Caregiver Signature _______________________________ Date __________  Clinician Signature _______________________________________ Date __________  Please bring this form and your medication list with you to all your follow-up doctor's appointments.
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
## 2021-06-17 NOTE — Progress Notes (Signed)
Physical Therapy Session Note  Patient Details  Name: Christian Russell MRN: [id number] Date of Birth: Dec 20, 1970  Today's Date: 06/17/2021 PT Individual Time: 1500-1525 PT Individual Time Calculation (min): 25 min   Short Term Goals: Week 1:  PT Short Term Goal 1 (Week 1): Pt will complete bed mobility with modA PT Short Term Goal 2 (Week 1): Pt will complete bed<>chair transfers with modA and LRAD PT Short Term Goal 3 (Week 1): Pt will ambulate 70ft with modA and LRAD PT Short Term Goal 4 (Week 1): Pt will improve pain management techniques to allow participation in therapy  Skilled Therapeutic Interventions/Progress Updates:  Pt supine in bed at start of session - agreeable to therapy with flat affect. Reports high levels of pain, similar to PT evaluation earlier. Provided redirection and mobility for pain management as pt is not due for any pain medications. Supine<>sit completed at [facility] level with heavy reliance on bed rail. Able to complete squat<>pivot transfer with mod/maxA from EOB to w/c - needing assistance for R knee stability and monitoring R foot placement during transfer to reduce risk of inversion and twisting the ankle as pt lacks proprioception and sensation to verbalize. Pt transported downstairs to 56M rehab gym in w/c for time. Worked on functional gait along hallway with use of hand rail on L and therapist on stool facilitating gait for paretic RLE - requires min/modA overall for ambulating ~68ft along the length of the rail and noted adduction of RLE with ankle inverting during stance. Pt transported back upstairs as pt reporting fatigue and increased pain, requesting to return to bed. Assisted to bed via squat<>pivot transfer with minA towards stronger L side with use of bed rail. Required minA for sit>supine for RLE management. Remained semi-reclined with bed alarm on and all immediate needs within reach.     Therapy Documentation Precautions:  Precautions Precautions:  Fall Precaution Comments: fall, slightly impulsive, right sided neglect Restrictions Weight Bearing Restrictions: No General:    Therapy/Group: Individual Therapy  Orrin Brigham 06/17/2021, 3:46 PM
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
## 2021-06-17 NOTE — Progress Notes (Signed)
Inpatient Rehabilitation Center Individual Statement of Services  Patient Name:  Christian Russell  Date:  06/17/2021  Welcome to the Inpatient Rehabilitation Center.  Our goal is to provide you with an individualized program based on your diagnosis and situation, designed to meet your specific needs.  With this comprehensive rehabilitation program, you will be expected to participate in at least 3 hours of rehabilitation therapies Monday-Friday, with modified therapy programming on the weekends.  Your rehabilitation program will include the following services:  Physical Therapy (PT), Occupational Therapy (OT), Speech Therapy (ST), 24 hour per day rehabilitation nursing, Therapeutic Recreaction (TR), Neuropsychology, Care Coordinator, Rehabilitation Medicine, Nutrition Services, and Pharmacy Services  Weekly team conferences will be held on Wednesday to discuss your progress.  Your Inpatient Rehabilitation Care Coordinator will talk with you frequently to get your input and to update you on team discussions.  Team conferences with you and your family in attendance may also be held.  Expected length of stay: 2-3 Weeks  Overall anticipated outcome: supervision-min assist level  Depending on your progress and recovery, your program may change. Your Inpatient Rehabilitation Care Coordinator will coordinate services and will keep you informed of any changes. Your Inpatient Rehabilitation Care Coordinator's name and contact numbers are listed  below.  The following services may also be recommended but are not provided by the Inpatient Rehabilitation Center:  Driving Evaluations Home Health Rehabiltiation Services Outpatient Rehabilitation Services Vocational Rehabilitation   Arrangements will be made to provide these services after discharge if needed.  Arrangements include referral to agencies that provide these services.  Your insurance has been verified to be:  BCBS Your primary doctor is:  Will be  Dr Chyrel Masson MD  Pertinent information will be shared with your doctor and your insurance company.  Inpatient Rehabilitation Care Coordinator:  Dossie Der, [place] [phone] or Luna Glasgow  Information discussed with and copy given to patient by: [name], 06/17/2021, 10:44 AM

## 2021-06-17 NOTE — Progress Notes (Signed)
Patient requested PRN pain medication. Patient educated to the fact the pain medication has been decreased to 5mg  instead of 10mg . Patient stated "If they do not increase medication to 10mg  I will not be doing therapy." PA made aware. , LPN

## 2021-06-17 NOTE — Progress Notes (Signed)
Inpatient Rehabilitation Admission Medication Review by a Pharmacist  A complete drug regimen review was completed for this patient to identify any potential clinically significant medication issues.  High Risk Drug Classes Is patient taking? Indication by Medication  Antipsychotic No   Anticoagulant No   Antibiotic No   Opioid Yes Oxycodone Prn pain  Antiplatelet No   Hypoglycemics/insulin No   Vasoactive Medication Yes Norvasc - HTN  Chemotherapy No   Other Yes Ator - HLD, CVA     Type of Medication Issue Identified Description of Issue Recommendation(s)  Drug Interaction(s) (clinically significant)     Duplicate Therapy     Allergy     No Medication Administration End Date     Incorrect Dose     Additional Drug Therapy Needed     Significant med changes from prior encounter (inform family/care partners about these prior to discharge).    Other       Clinically significant medication issues were identified that warrant physician communication and completion of prescribed/recommended actions by midnight of the next day:  No  Name of provider notified for urgent issues identified:   Provider Method of Notification:     Pharmacist comments:   Time spent performing this drug regimen review (minutes):  [address], PharmD, [place], AAHIVP, CPP Infectious Disease Pharmacist 06/16/2021 4:06 PM
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
## 2021-06-17 NOTE — Progress Notes (Signed)
Inpatient Rehabilitation  Patient information reviewed and entered into eRehab system by Morning Halberg Katie Faraone, OTR/L.   Information including medical coding, functional ability and quality indicators will be reviewed and updated through discharge.    

## 2021-06-18 MED ORDER — OXYCODONE HCL 5 MG PO TABS
10.0000 mg | ORAL_TABLET | Freq: Four times a day (QID) | ORAL | Status: DC | PRN
  Administered 2021-06-18 – 2021-06-30 (×46): 10 mg via ORAL
  Filled 2021-06-18 (×47): qty 2

## 2021-06-18 MED ORDER — BACLOFEN 5 MG HALF TABLET
5.0000 mg | ORAL_TABLET | Freq: Every day | ORAL | Status: DC
  Administered 2021-06-18 – 2021-06-26 (×9): 5 mg via ORAL
  Filled 2021-06-18 (×9): qty 1

## 2021-06-18 NOTE — Progress Notes (Signed)
Physical Therapy Session Note  Patient Details  Name: Christian Russell MRN: [id number] Date of Birth: Jan 04, 1971  Today's Date: 06/18/2021 PT Individual Time: 2751-7001; 1032-1100 PT Individual Time Calculation (min): 55 min and 28 mins  Short Term Goals: Week 1:  PT Short Term Goal 1 (Week 1): Pt will complete bed mobility with modA PT Short Term Goal 2 (Week 1): Pt will complete bed<>chair transfers with modA and LRAD PT Short Term Goal 3 (Week 1): Pt will ambulate 6ft with modA and LRAD PT Short Term Goal 4 (Week 1): Pt will improve pain management techniques to allow participation in therapy  Skilled Therapeutic Interventions/Progress Updates:    Session 1: Patient received sitting up in bed, agreeable to PT. He reports 7/10 pain in low back and R LE (gestured to general area of his hip/groin)- RN present to provide pain rx. PT providing rest breaks, distractions and repositioning to assist with pain management. He remained perseverative on his pain levels throughout therapy session voicing concerns that he didn't receive rx prior to session to allow medicine to begin working. PT redirecting patient throughout session. He was able to come sit edge of bed with supervision and compensatory techniques to manage R LE despite multimodal cues to not do soto allow for proper activation of R hip flexors. Patient requiring cues to attend to R UE. ModA stand pivot to wc leading R which patient essentially hopping on L LE. PT transport to therapy gym for time management and energy conservation. PT providing patient with RW and modified hand grip for R UE. He ambulated x15 ft with ModA/MaxA. Intermittent assist for advancing R LE through full swing phase for appropriate step length. R knee buckling throughout stance phase with heavy reliance on B UE + PT support. No R ankle inversion noted, but patient often stepping with L LE prior to fully advancing R LE (inattention?) resulting in more significant R knee  buckling. Patient reporting significant fatigue after short gait bout. Standing NMR task weight shifting moving stack of cones from L to R using L UE. ModA to come to stand and blocking on R knee. Patient with preferential weight shift L noted. Standing upright posture retrieving horseshoes from top of mirror with emphasis on equal B weight distribution, again R knee blocked. PT applying DF wrap to R LE and patient ambulated 10 ft (limited by poor endurance). Improving swing phase mechanics with addition of wrap noted. Patient performing wc mob using L hemi technique x167ft. Ambulated final 59ft with DF wrap + RW and ModA. MinA to return supine. Bed alarm on, call light within reach.    Session 2: Patient received reclined in bed, agreeable to PT. He was able to come sit edge of bed with supervision and compensatory strategies to manage R LE. MinA stand pivot to wc. PT transporting patient in wc to therapy gym for time management and energy conservation. Transfer to NuStep. X10 mins completed with B LE with emphasis on R LE power and alignment. Throughout time on NuStep, patient voicing frustrations to PT regarding how his pain is being managed. He reports that his pain rx dose has been "halved" and is frustrated that "other people are getting what they need" when he feels as though he is not. PT attempted to redirect patient to therapeutic task, however, patient continued to voice his frustrations in increasingly aggressive tone. Patient stating that he would "leave this place" and have "people help" if his pain rx was not adjusted. With patient  there, PT messaged MD regarding pain rx dose. PT informing patient that this therapist does not have control over rx dosing and that many factors are considered when prescribing a specific dose. Patient returning to his room in wc, transferring to bed via squat pivot with CGA. He was apologetic for raising his voice at this PT, but reaffirmed that he was not pleased with  his current pain management. PT discussed pain management with RN and alerted RN and MD to patients comments regarding leaving the unit with or without assist. Bed alarm on, call light within reach.   Therapy Documentation Precautions:  Precautions Precautions: Fall Precaution Comments: fall, slightly impulsive, right sided neglect Restrictions Weight Bearing Restrictions: No     Therapy/Group: Individual Therapy  Elizebeth Koller, PT, DPT, CBIS  06/18/2021, 7:30 AM

## 2021-06-18 NOTE — Progress Notes (Signed)
Occupational Therapy Session Note  Patient Details  Name: Christian Russell MRN: [id number] Date of Birth: 12-14-1970  Today's Date: 06/18/2021 OT Individual Time: 5686-1683 OT Individual Time Calculation (min): 26 min  Today's Date: 06/18/2021 OT Individual Time: 7290-2111 OT Individual Time Calculation (min): 59 min   Short Term Goals: Week 1:  OT Short Term Goal 1 (Week 1): Pt will complete sit <>stand transfer with LRAD needing min assist in prep for self care. OT Short Term Goal 2 (Week 1): Pt will donn/doff pants with CGA. OT Short Term Goal 3 (Week 1): Pt will complete tub shower transfer with min assist. OT Short Term Goal 4 (Week 1): Pt will increase RUE MMT to 3+/5 to facilitate improved independence with functional transfers.  Skilled Therapeutic Interventions/Progress Updates:     Pt received in bed with bed with no pain reported  ADL: Pt completes ADL at overall MIN Level. Skilled interventions include: VC for squat pivot transfer technique to L with facilitation of ant weight shift/foot placement/knee guarding, HOH A to incorporate RUE Into bimanual tasks, VC ofr UB hemi dressing techniques, & set up of the ADL items on R to improve R visual scanning. Pt very impulsive and impatient with mobility but improves with cuing. Pt able to don shirt with S overall.  Therapeutic activity Transitional movement training at sink sit to stand x8 progressing to no hand push from seat and BUE on R thigh for tactile input at thigh with cuing from OT for quad engagement. Improved from MIN A to supervision for sit to stand. Stand pivot transfer with CGA back to bed  Pt left at end of session in bed with exit alarm on, call light in reach and all needs met  Session 2:  Pt received in bed with premedicated/unrated pain. Pt does not mention pain throughout session despite where mask has rubbed along ears   Neuromuscular Reeducation Pt completes stand pivot transfers with increased  attention/facilitation of postural alignment, weight shift forward, and even weight bearing for sit to stand and intentional pivot steps.   In supine, pt able to flex shoulder/reach hand up to the ceiling with MIN A and bend/straighten elbow with resistance. Once arm straight and shoulder flexed to 90* pt only able to hold position statically for ~2 seconds. Pt with sensory and proprioception deficits and requires cuing for visual attention to RUE during movements and to not hold breathe with exertion.   Pt sits EOM and reaches for large foam blocks for exaggerated grasp/release with MAX A for functonal reach facilitation and inhibition of shoulder hike at trap.   Continued education throughout on motor control, neuroplasticity and CVA recovery/trajectory.  Pt left at end of session in bed with exit alarm on, call light in reach and all needs met    Therapy Documentation Precautions:  Precautions Precautions: Fall Precaution Comments: fall, slightly impulsive, right sided neglect Restrictions Weight Bearing Restrictions: No General:     Therapy/Group:   Tonny Branch 06/18/2021, 5:49 AM

## 2021-06-18 NOTE — Plan of Care (Signed)
  Problem: Consults Goal: RH STROKE PATIENT EDUCATION Description: See Patient Education module for education specifics  Outcome: Progressing   Problem: RH BOWEL ELIMINATION Goal: RH STG MANAGE BOWEL WITH ASSISTANCE Description: STG Manage Bowel with mod I Assistance. Outcome: Progressing Goal: RH STG MANAGE BOWEL W/MEDICATION W/ASSISTANCE Description: STG Manage Bowel with Medication with  mod I Assistance. Outcome: Progressing   Problem: RH SAFETY Goal: RH STG ADHERE TO SAFETY PRECAUTIONS W/ASSISTANCE/DEVICE Description: STG Adhere to Safety Precautions With cues Assistance/Device. Outcome: Progressing   Problem: RH PAIN MANAGEMENT Goal: RH STG PAIN MANAGED AT OR BELOW PT'S PAIN GOAL Description: At or below level 4 Outcome: Progressing   Problem: RH KNOWLEDGE DEFICIT Goal: RH STG INCREASE KNOWLEDGE OF HYPERTENSION Description: Patient will be able to manage HTn with medications and dietary modifications using handouts and educational resources independently Outcome: Progressing Goal: RH STG INCREASE KNOWLEGDE OF HYPERLIPIDEMIA Outcome: Progressing   

## 2021-06-18 NOTE — Progress Notes (Signed)
PROGRESS NOTE   Subjective/Complaints: C/o low back pain and occasional muscle spasms in his lower extremities. Was not happy that oxycodone was decreased to 5mg - feels 10mg  helped him more and I have increased back to 10mg   ROS: denies insomnia, constipation, + low back pain   Objective:   No results found. Recent Labs    06/17/21 0510  WBC 8.5  HGB 14.5  HCT 42.7  PLT 374   Recent Labs    06/17/21 0510  NA 139  K 4.5  CL 101  CO2 32  GLUCOSE 108*  BUN 18  CREATININE 0.73  CALCIUM 9.5    Intake/Output Summary (Last 24 hours) at 06/18/2021 1619 Last data filed at 06/18/2021 1510 Gross per 24 hour  Intake 1008 ml  Output 800 ml  Net 208 ml        Physical Exam: Vital Signs Blood pressure 103/81, pulse 94, temperature 98.2 F (36.8 C), resp. rate 18, SpO2 98 %. Gen: no distress, normal appearing HEENT: oral mucosa pink and moist, NCAT Cardio: Reg rate Chest: normal effort, normal rate of breathing Abd: soft, non-distended Ext: no edema:  Psych: pleasant, normal affect Skin: intact Neuro: Alert and oriented x3 Musculoskeletal: Right sided strength 2/5 EF and EE, 3/5 HF and KE, 0/5 WE and DF   Assessment/Plan: 1. Functional deficits which require 3+ hours per day of interdisciplinary therapy in a comprehensive inpatient rehab setting. Physiatrist is providing close team supervision and 24 hour management of active medical problems listed below. Physiatrist and rehab team continue to assess barriers to discharge/monitor patient progress toward functional and medical goals  Care Tool:  Bathing              Bathing assist Assist Level: Moderate Assistance - Patient 50 - 74%     Upper Body Dressing/Undressing Upper body dressing        Upper body assist Assist Level: Minimal Assistance - Patient > 75%    Lower Body Dressing/Undressing Lower body dressing            Lower body  assist Assist for lower body dressing: Moderate Assistance - Patient 50 - 74%     Toileting Toileting    Toileting assist Assist for toileting: Maximal Assistance - Patient 25 - 49%     Transfers Chair/bed transfer  Transfers assist     Chair/bed transfer assist level: Maximal Assistance - Patient 25 - 49%     Locomotion Ambulation   Ambulation assist      Assist level: Maximal Assistance - Patient 25 - 49% Assistive device: Parallel bars Max distance: 8   Walk 10 feet activity   Assist  Walk 10 feet activity did not occur: Safety/medical concerns        Walk 50 feet activity   Assist Walk 50 feet with 2 turns activity did not occur: Safety/medical concerns         Walk 150 feet activity   Assist Walk 150 feet activity did not occur: Safety/medical concerns         Walk 10 feet on uneven surface  activity   Assist Walk 10 feet on uneven surfaces activity did not occur:  Safety/medical concerns         Wheelchair     Assist Is the patient using a wheelchair?: Yes Type of Wheelchair: Manual    Wheelchair assist level: Dependent - Patient 0% Max wheelchair distance: 150    Wheelchair 50 feet with 2 turns activity    Assist        Assist Level: Dependent - Patient 0%   Wheelchair 150 feet activity     Assist      Assist Level: Dependent - Patient 0%   Blood pressure 103/81, pulse 94, temperature 98.2 F (36.8 C), resp. rate 18, SpO2 98 %.  Medical Problem List and Plan: 1.  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  Antithrombotics: -DVT/anticoagulation:  Mechanical:  Antiembolism stockings, knee (TED hose) Bilateral lower extremities             -antiplatelet therapy: N/A 3. Pain:  Increase Oxycodone back to 10mg  q6H prn. Robaxin-750 milligram every 6 hours as needed muscle spasms 4. Mood/sleep: Team to provide emotional support.  Melatonin 3 mg nightly as well as Restoril.               -antipsychotic agents: N/A             -pt has worked 3rd shift for 20 years and finds it difficult to sleep at night                         -continue above meds and observe 5. Neuropsych: This patient is capable of making decisions on his own behalf. 6. Skin/Wound Care: Routine skin checks 7. Fluids/Electrolytes/Nutrition: Routine in and outs with follow-up chemistries 8.  Hypertension. Monitor with increased mobility  D/c norvasc 9.  Hyperlipidemia.  Lipitor 10.  Urine drug screen positive marijuana.  Provide counseling 11. Hyperglycemia: educated to avoid drinks and foods with added sugar.  12. Lower extremity spasms: start baclofen 5mg  at night  LOS: 2 days A FACE TO FACE EVALUATION WAS PERFORMED  Mitra Duling 06/18/2021, 4:19 PM

## 2021-06-18 NOTE — Progress Notes (Signed)
Speech Language Pathology Daily Session Note  Patient Details  Name: Christian Russell MRN: [id number] Date of Birth: [birth year]  Today's Date: 06/18/2021 SLP Individual Time: 1136-1206 SLP Individual Time Calculation (min): 30 min  Short Term Goals: Week 1: SLP Short Term Goal 1 (Week 1): Patient will maintain 90% intelligibility at conversational level with supervision A cues for decreasing speech rate. SLP Short Term Goal 2 (Week 1): Patient will demonstrate awareness to errors and potential errors during functional tasks and simulated funcitonal tasks, with minA verbal and visual cues. SLP Short Term Goal 3 (Week 1): Patient will recall specific information related to daily therapeutic and medical interventions with minA verbal, visual cues. SLP Short Term Goal 4 (Week 1): Patient will perform mildly complex functional problem solving tasks with approximately 80% accuracy and minA cues. SLP Short Term Goal 5 (Week 1): Patient will demonstrate selective attention during completion of functional tasks with minA verbal cues to redirect.  Skilled Therapeutic Interventions: Pt seen for skilled ST with focus on cognitive goals, in bed and agreeable to ST intervention. Pt denying any cognitive deficits or changes s/p CVA, completing mildly complex problem solving tasks in structured task related to home environment with min A cues for 50% accuracy. At this point, pt becoming agitated and perseverating on pain medication dosage being "cut in half" and his desire to leave AMA if it isn't fixed. Pt kept repeating "once I hit that point, I'm leaving" and "I'll go home and crawl to the bathroom if I need to". Max cues and consistent education ineffective in redirecting patient who repeated saying he doesn't understand why "people at home can get 20mg  and I can't get but 5mg ". Per PT note, MD has been made aware of situation and patient's desire for more pain medication. Pt apologetic at end of session but  reports he will not continue therapy if issue isn't resolved. Pt left in bed with alarm set and all needs within reach. Cont ST POC.   Pain Pain Assessment Pain Scale: 0-10 Pain Score: 6  Pain Type: Acute pain Pain Location: Back Pain Orientation: Lower Pain Radiating Towards: right leg Pain Descriptors / Indicators: Sharp Pain Frequency: Intermittent Pain Onset: Progressive Patients Stated Pain Goal: 2 Pain Intervention(s): Medication (See eMAR) Multiple Pain Sites: No  Therapy/Group: Individual Therapy  06/18/2021, 11:57 AM

## 2021-06-19 NOTE — IPOC Note (Signed)
Overall Plan of Care [facility]) Patient Details Name: Christian Russell MRN: [id number] DOB: [birth year]  Admitting Diagnosis: ICH (intracerebral hemorrhage) [facility])  Hospital Problems: Principal Problem:   ICH (intracerebral hemorrhage) (HCC)     Functional Problem List: Nursing Medication Management, Safety, Bowel, Endurance, Pain  PT Balance, Pain, Behavior, Endurance, Motor, Sensory, Safety  OT Balance, Perception, Behavior, Safety, Cognition, Sensory, Endurance, Motor  SLP Cognition, Perception, Motor  TR         Basic ADL's: OT Grooming, Bathing, Dressing, Toileting     Advanced  ADL's: OT       Transfers: PT Bed Mobility, Bed to Chair, Customer service manager, Tub/Shower     Locomotion: PT Stairs, Ambulation     Additional Impairments: OT Fuctional Use of Upper Extremity  SLP None      TR      Anticipated Outcomes Item Anticipated Outcome  Self Feeding setup  Swallowing  N/A   Basic self-care  mod I-supervision  Toileting  supervision   Bathroom Transfers supervision  Bowel/Bladder  Manage bowel with mod i assist  Transfers  minA with LRAD  Locomotion  minA with LRAD  Communication  mod I  Cognition  mod I basic to mild complex cognitive  Pain  at or below level 4  Safety/Judgment  maintain safety w cues/reminders   Therapy Plan: PT Intensity: Minimum of 1-2 x/day ,45 to 90 minutes PT Frequency: 5 out of 7 days PT Duration Estimated Length of Stay: 2-3 weeks OT Intensity: Minimum of 1-2 x/day, 45 to 90 minutes OT Frequency: 5 out of 7 days OT Duration/Estimated Length of Stay: 3 weeks SLP Intensity: Minumum of 1-2 x/day, 30 to 90 minutes SLP Frequency: 3 to 5 out of 7 days SLP Duration/Estimated Length of Stay: 2 weeks   Due to the current state of emergency, patients may not be receiving their 3-hours of Medicare-mandated therapy.   Team Interventions: Nursing Interventions Disease Management/Prevention, Medication Management, Bowel Management,  Discharge Planning, Pain Management, Patient/Family Education  PT interventions Ambulation/gait training, Cognitive remediation/compensation, Discharge planning, DME/adaptive equipment instruction, Functional mobility training, Pain management, Psychosocial support, Splinting/orthotics, Therapeutic Activities, UE/LE Strength taining/ROM, Visual/perceptual remediation/compensation, Wheelchair propulsion/positioning, UE/LE Coordination activities, Therapeutic Exercise, Stair training, Skin care/wound management, Patient/family education, Neuromuscular re-education, Functional electrical stimulation, Disease management/prevention, Firefighter, Warden/ranger  OT Interventions Pain management, Warden/ranger, Discharge planning, Functional electrical stimulation, Self Care/advanced ADL retraining, Therapeutic Activities, UE/LE Coordination activities, Visual/perceptual remediation/compensation, Therapeutic Exercise, Skin care/wound managment, Patient/family education, Functional mobility training, Disease mangement/prevention, Cognitive remediation/compensation, Firefighter, Fish farm manager, Neuromuscular re-education, Psychosocial support, Splinting/orthotics, Wheelchair propulsion/positioning, UE/LE Strength taining/ROM  SLP Interventions Cognitive remediation/compensation, Speech/Language facilitation, Medication managment, Environmental controls, Patient/family education, Functional tasks  TR Interventions    SW/CM Interventions Discharge Planning, Psychosocial Support, Patient/Family Education   Barriers to Discharge MD  Medical stability  Nursing Decreased caregiver support, Home environment access/layout mobilie home w 6 ste bil rails w 69 yr old mother  PT Decreased caregiver support, Home environment Best boy, Insurance for SNF coverage, Lack of/limited family support, Behavior, Other (comments) Pain  OT Decreased caregiver  support Pt lives with mother however did not mention any other available social supports  SLP      SW Decreased caregiver support, Community education officer for SNF coverage     Team Discharge Planning: Destination: PT-Home ,OT- Home , SLP-Home Projected Follow-up: PT-Outpatient PT, 24 hour supervision/assistance, OT-  Other (comment) (outpatient versus home health pending progress), SLP-Other (comment) (TBD pending progress) Projected Equipment Needs: PT-To  be determined, OT- To be determined, SLP-None recommended by SLP Equipment Details: PT- , OT-Pt reports he has a bedside commode and shower bench at home from his mother previously needing Patient/family involved in discharge planning: PT- Patient,  OT-Patient, SLP-Patient  MD ELOS: 10-14 days Medical Rehab Prognosis:  Excellent Assessment: Christian Russell is a 50 year old man admitted to CIR with right side weakness and dysarthria secondary to 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 Medications are being managed, and labs and vitals are being monitored regularly.     See Team Conference Notes for weekly updates to the plan of care

## 2021-06-19 NOTE — Progress Notes (Signed)
PROGRESS NOTE   Subjective/Complaints: Continues to have some muscle spasms, usually worst after therapy sessions- appears to have some restless legs- he felt baclofen last night helped Today is his birthday! He would like to have more visitors.   ROS: denies insomnia, constipation, + low back pain, +lower extremity spasms   Objective:   No results found. Recent Labs    06/17/21 0510  WBC 8.5  HGB 14.5  HCT 42.7  PLT 374   Recent Labs    06/17/21 0510  NA 139  K 4.5  CL 101  CO2 32  GLUCOSE 108*  BUN 18  CREATININE 0.73  CALCIUM 9.5    Intake/Output Summary (Last 24 hours) at 06/19/2021 1056 Last data filed at 06/19/2021 0730 Gross per 24 hour  Intake 916 ml  Output 450 ml  Net 466 ml        Physical Exam: Vital Signs Blood pressure 108/78, pulse 82, temperature 98.1 F (36.7 C), temperature source Oral, resp. rate 16, SpO2 97 %. Gen: no distress, normal appearing HEENT: oral mucosa pink and moist, NCAT Cardio: Reg rate Chest: normal effort, normal rate of breathing Abd: soft, non-distended Ext: no edema Psych: pleasant, normal affect Skin: intact Neuro: Alert and oriented x3 Musculoskeletal: Right sided strength 2/5 EF and EE, 3/5 HF and KE, 0/5 WE and DF   Assessment/Plan: 1. Functional deficits which require 3+ hours per day of interdisciplinary therapy in a comprehensive inpatient rehab setting. Physiatrist is providing close team supervision and 24 hour management of active medical problems listed below. Physiatrist and rehab team continue to assess barriers to discharge/monitor patient progress toward functional and medical goals  Care Tool:  Bathing              Bathing assist Assist Level: Moderate Assistance - Patient 50 - 74%     Upper Body Dressing/Undressing Upper body dressing        Upper body assist Assist Level: Minimal Assistance - Patient > 75%    Lower Body  Dressing/Undressing Lower body dressing            Lower body assist Assist for lower body dressing: Moderate Assistance - Patient 50 - 74%     Toileting Toileting    Toileting assist Assist for toileting: Maximal Assistance - Patient 25 - 49%     Transfers Chair/bed transfer  Transfers assist     Chair/bed transfer assist level: Maximal Assistance - Patient 25 - 49%     Locomotion Ambulation   Ambulation assist      Assist level: Maximal Assistance - Patient 25 - 49% Assistive device: Parallel bars Max distance: 8   Walk 10 feet activity   Assist  Walk 10 feet activity did not occur: Safety/medical concerns        Walk 50 feet activity   Assist Walk 50 feet with 2 turns activity did not occur: Safety/medical concerns         Walk 150 feet activity   Assist Walk 150 feet activity did not occur: Safety/medical concerns         Walk 10 feet on uneven surface  activity   Assist Walk 10 feet  on uneven surfaces activity did not occur: Safety/medical concerns         Wheelchair     Assist Is the patient using a wheelchair?: Yes Type of Wheelchair: Manual    Wheelchair assist level: Dependent - Patient 0% Max wheelchair distance: 150    Wheelchair 50 feet with 2 turns activity    Assist        Assist Level: Dependent - Patient 0%   Wheelchair 150 feet activity     Assist      Assist Level: Dependent - Patient 0%   Blood pressure 108/78, pulse 82, temperature 98.1 F (36.7 C), temperature source Oral, resp. rate 16, SpO2 97 %.  Medical Problem List and Plan: 1.  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  Antithrombotics: -DVT/anticoagulation:  Mechanical:  Antiembolism  stockings, knee (TED hose) Bilateral lower extremities             -antiplatelet therapy: N/A 3. Pain: Continue Oxycodone 10mg  q6H prn- was upset when decreased. Robaxin-750 milligram every 6 hours as needed muscle spasms. Add kpad for lowe back 4. Mood/sleep: Team to provide emotional support.  Melatonin 3 mg nightly as well as Restoril.               -antipsychotic agents: N/A             -pt has worked 3rd shift for 20 years and finds it difficult to sleep at night                         -continue above meds and observe 5. Neuropsych: This patient is capable of making decisions on his own behalf. 6. Skin/Wound Care: Routine skin checks 7. Fluids/Electrolytes/Nutrition: Routine in and outs with follow-up chemistries 8.  Hypertension. Monitor with increased mobility  D/c norvasc- BP remains excellent 9.  Hyperlipidemia.  TG and LDL elevated. Continue Lipitor 10.  Urine drug screen positive marijuana.  Provide counseling 11. Hyperglycemia: educated to avoid drinks and foods with added sugar.  12. Lower extremity spasms: continue baclofen 5mg  at night- helped.  13. Therapeutic grounds pass to see friends and dog on grounds for his birthday today  LOS: 3 days A FACE TO FACE EVALUATION WAS PERFORMED  Lajune Perine 06/19/2021, 10:56 AM

## 2021-06-19 NOTE — Progress Notes (Signed)
Had uneventful night. Requested pain medications Q6 hours for pain. No behavior issues noted. Safety maintained.

## 2021-06-19 NOTE — Plan of Care (Signed)
  Problem: Consults Goal: RH STROKE PATIENT EDUCATION Description: See Patient Education module for education specifics  Outcome: Progressing   Problem: RH BOWEL ELIMINATION Goal: RH STG MANAGE BOWEL WITH ASSISTANCE Description: STG Manage Bowel with mod I Assistance. Outcome: Progressing Goal: RH STG MANAGE BOWEL W/MEDICATION W/ASSISTANCE Description: STG Manage Bowel with Medication with  mod I Assistance. Outcome: Progressing   Problem: RH SAFETY Goal: RH STG ADHERE TO SAFETY PRECAUTIONS W/ASSISTANCE/DEVICE Description: STG Adhere to Safety Precautions With cues Assistance/Device. Outcome: Progressing   Problem: RH PAIN MANAGEMENT Goal: RH STG PAIN MANAGED AT OR BELOW PT'S PAIN GOAL Description: At or below level 4 Outcome: Progressing   Problem: RH KNOWLEDGE DEFICIT Goal: RH STG INCREASE KNOWLEDGE OF HYPERTENSION Description: Patient will be able to manage HTn with medications and dietary modifications using handouts and educational resources independently Outcome: Progressing Goal: RH STG INCREASE KNOWLEGDE OF HYPERLIPIDEMIA Outcome: Progressing   

## 2021-06-20 DIAGNOSIS — I619 Nontraumatic intracerebral hemorrhage, unspecified: Secondary | ICD-10-CM

## 2021-06-20 MED ORDER — ONDANSETRON HCL 4 MG PO TABS
4.0000 mg | ORAL_TABLET | Freq: Three times a day (TID) | ORAL | Status: DC | PRN
  Administered 2021-06-20 – 2021-06-21 (×2): 4 mg via ORAL
  Filled 2021-06-20 (×2): qty 1

## 2021-06-20 NOTE — Progress Notes (Signed)
Patient ID: Christian Russell, male   DOB: 11-Nov-1970, 50 y.o.   MRN: [id number] Met with the patient to review rehab routine, therapy schedule and plan of care with role of the nurse care manager. Discussed medical status and secondary risk management including HTN, HLD. Reviewed medications and dietary modification recommendations. Patient noted an understanding of information reviewed. Continue to follow along to discharge to address educational needs and collaborate with the team to facilitate preparation for discharge home with his mother.Margarito Liner

## 2021-06-20 NOTE — Plan of Care (Signed)
  Problem: Consults Goal: RH STROKE PATIENT EDUCATION Description: See Patient Education module for education specifics  Outcome: Progressing   Problem: RH BOWEL ELIMINATION Goal: RH STG MANAGE BOWEL WITH ASSISTANCE Description: STG Manage Bowel with mod I Assistance. Outcome: Progressing Goal: RH STG MANAGE BOWEL W/MEDICATION W/ASSISTANCE Description: STG Manage Bowel with Medication with  mod I Assistance. Outcome: Progressing   Problem: RH SAFETY Goal: RH STG ADHERE TO SAFETY PRECAUTIONS W/ASSISTANCE/DEVICE Description: STG Adhere to Safety Precautions With cues Assistance/Device. Outcome: Progressing   Problem: RH PAIN MANAGEMENT Goal: RH STG PAIN MANAGED AT OR BELOW PT'S PAIN GOAL Description: At or below level 4 Outcome: Progressing   Problem: RH KNOWLEDGE DEFICIT Goal: RH STG INCREASE KNOWLEDGE OF HYPERTENSION Description: Patient will be able to manage HTn with medications and dietary modifications using handouts and educational resources independently Outcome: Progressing Goal: RH STG INCREASE KNOWLEGDE OF HYPERLIPIDEMIA Outcome: Progressing

## 2021-06-20 NOTE — Progress Notes (Signed)
Physical Therapy Session Note  Patient Details  Name: Christian Russell MRN: [id number] Date of Birth: 03-25-1971  Today's Date: 06/20/2021 PT Individual Time: 0800-0855 PT Individual Time Calculation (min): 55 min   Short Term Goals: Week 1:  PT Short Term Goal 1 (Week 1): Pt will complete bed mobility with modA PT Short Term Goal 2 (Week 1): Pt will complete bed<>chair transfers with modA and LRAD PT Short Term Goal 3 (Week 1): Pt will ambulate 35ft with modA and LRAD PT Short Term Goal 4 (Week 1): Pt will improve pain management techniques to allow participation in therapy  Skilled Therapeutic Interventions/Progress Updates:      Pt presents supine in bed, watching TV, awake and agreeable to PT treatment. He reports that his medical team has adjusted his pain medications and he is pleased with how his pain is being treated. Deferred questions to avoid pain focused behavior. Pt completed supine<>sit with minA with heavy use of bed rail, minA needed for RLE management. Pt is able to sit unsupported with supervision at EOB and then donned hospital grip socks with totalA for time. Completes squat<>pivot transfer with modA from EOB to w/c, towards paretic R side. He requests to brush his teeth prior to leaving his room - wheeled sinkside where he completed oral care with supervision assist, primarily using his stronger LUE to compensate for R sided weakness during self care. Pt transported downstairs to 18M rehab gym for time in his w/c. Assisted to mat table with squat<>pivot transfer, towards his stronger L side, completes at [facility] level. Worked on repeated sit<>stands to RW with minA from low mat table height - noted R foot kicking into knee flexion with R foot inverting. Provided large mirror to assist with visual feedback and he responded well to this - able to correct R knee flexion and also some of the inverting. Ace wrapped R ankle into DF assist with bias towards eversion to reduce risk of turning  during gait. He ambulated ~75ft with RW and min/modA on level ground - difficulty with R foot placement, R knee extension, and narrow BOS. Also had significant R ankle inversion during stance phase - deferred further gait for safety and educated him on proper footwear to improve ankle stability. He reports his mother can bring in shoes from home. Worked on supine there-ex to promote RLE strengthening. Completed 2x10 of the following: heel slides/leg press with manual resistance, hip abduction/adduction with manual resistance, and bridges. Pt assisted back to his w/c via squat<>pivot minA and returned upstairs to his room where he was assisted back to bed in similar manner (both towards stronger L side). Pt completes sit>supine with supervision with heavy reliance on bed rail. Remained supine in bed at conclusion of session with bed alarm on and all needs in reach. Retrieved ice pack for his neck as he was c/o L lateral neck pain, not able to receive pain medications yet.   Therapy Documentation Precautions:  Precautions Precautions: Fall Precaution Comments: fall, slightly impulsive, right sided neglect Restrictions Weight Bearing Restrictions: No General:    Therapy/Group: Individual Therapy  Orrin Brigham 06/20/2021, 7:48 AM

## 2021-06-20 NOTE — Progress Notes (Signed)
PROGRESS NOTE   Subjective/Complaints:  Left sided neck pain , PT brought ice after therapy   ROS: denies insomnia, constipation, + low back pain, +lower extremity spasms   Objective:   No results found. No results for input(s): WBC, HGB, HCT, PLT in the last 72 hours.  No results for input(s): NA, K, CL, CO2, GLUCOSE, BUN, CREATININE, CALCIUM in the last 72 hours.   Intake/Output Summary (Last 24 hours) at 06/20/2021 0850 Last data filed at 06/20/2021 0829 Gross per 24 hour  Intake 906 ml  Output 1325 ml  Net -419 ml         Physical Exam: Vital Signs Blood pressure 113/78, pulse 73, temperature 97.7 F (36.5 C), temperature source Oral, resp. rate 18, height 5\' 4"  (1.626 m), SpO2 93 %.  General: No acute distress Mood and affect are appropriate Heart: Regular rate and rhythm no rubs murmurs or extra sounds Lungs: Clear to auscultation, breathing unlabored, no rales or wheezes Abdomen: Positive bowel sounds, soft nontender to palpation, nondistended Extremities: No clubbing, cyanosis, or edema Skin: No evidence of breakdown, no evidence of rash  Sensation reduced LT on RIght side  Musculoskeletal: Right sided strength 3/5 EF and EE, 3/5 HF and KE, 2/5 WE and 0/5 DF   Assessment/Plan: 1. Functional deficits which require 3+ hours per day of interdisciplinary therapy in a comprehensive inpatient rehab setting. Physiatrist is providing close team supervision and 24 hour management of active medical problems listed below. Physiatrist and rehab team continue to assess barriers to discharge/monitor patient progress toward functional and medical goals  Care Tool:  Bathing              Bathing assist Assist Level: Moderate Assistance - Patient 50 - 74%     Upper Body Dressing/Undressing Upper body dressing        Upper body assist Assist Level: Minimal Assistance - Patient > 75%    Lower Body  Dressing/Undressing Lower body dressing            Lower body assist Assist for lower body dressing: Moderate Assistance - Patient 50 - 74%     Toileting Toileting    Toileting assist Assist for toileting: Maximal Assistance - Patient 25 - 49%     Transfers Chair/bed transfer  Transfers assist     Chair/bed transfer assist level: Maximal Assistance - Patient 25 - 49%     Locomotion Ambulation   Ambulation assist      Assist level: Maximal Assistance - Patient 25 - 49% Assistive device: Parallel bars Max distance: 8   Walk 10 feet activity   Assist  Walk 10 feet activity did not occur: Safety/medical concerns        Walk 50 feet activity   Assist Walk 50 feet with 2 turns activity did not occur: Safety/medical concerns         Walk 150 feet activity   Assist Walk 150 feet activity did not occur: Safety/medical concerns         Walk 10 feet on uneven surface  activity   Assist Walk 10 feet on uneven surfaces activity did not occur: Safety/medical concerns  Wheelchair     Assist Is the patient using a wheelchair?: Yes Type of Wheelchair: Manual    Wheelchair assist level: Dependent - Patient 0% Max wheelchair distance: 150    Wheelchair 50 feet with 2 turns activity    Assist        Assist Level: Dependent - Patient 0%   Wheelchair 150 feet activity     Assist      Assist Level: Dependent - Patient 0%   Blood pressure 113/78, pulse 73, temperature 97.7 F (36.5 C), temperature source Oral, resp. rate 18, height 5\' 4"  (1.626 m), SpO2 93 %.  Medical Problem List and Plan: 1.  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   Antithrombotics: -DVT/anticoagulation:  Mechanical:  Antiembolism stockings, knee (TED hose) Bilateral lower extremities             -antiplatelet therapy: N/A 3. Pain: Continue Oxycodone 10mg  q6H prn- was upset when decreased. Robaxin-750 milligram every 6 hours as needed muscle spasms. Add kpad for lowe back 4. Mood/sleep: Team to provide emotional support.  Melatonin 3 mg nightly as well as Restoril.               -antipsychotic agents: N/A             -pt has worked 3rd shift for 20 years and finds it difficult to sleep at night                         -continue above meds and observe 5. Neuropsych: This patient is capable of making decisions on his own behalf. 6. Skin/Wound Care: Routine skin checks 7. Fluids/Electrolytes/Nutrition: Routine in and outs with follow-up chemistries 8.  Hypertension. Monitor with increased mobility Vitals:   06/19/21 2008 06/20/21 0325  BP: 133/87 113/78  Pulse: 91 73  Resp: 14 18  Temp: 98.4 F (36.9 C) 97.7 F (36.5 C)  SpO2: 99% 93%  Controlled off meds 10/24  9.  Hyperlipidemia.  TG and LDL elevated. Continue Lipitor 10.  Urine drug screen positive marijuana.  Provide counseling 11. Hyperglycemia: educated to avoid drinks and foods with added sugar.  12. Lower extremity spasms: continue baclofen 5mg  at night- helped.  13. Therapeutic grounds pass to see friends and dog on grounds for his birthday today  LOS: 4 days A FACE TO FACE EVALUATION WAS PERFORMED  06/22/21 06/20/2021, 8:50 AM

## 2021-06-20 NOTE — Progress Notes (Signed)
Occupational Therapy Session Note  Patient Details  Name: Christian Russell MRN: [id number] Date of Birth: [birth year]  Today's Date: 06/20/2021 OT Individual Time: 1400-1515 OT Individual Time Calculation (min): 75 min  Missed last 15 minutes of session due to fatigue and neck pain  Short Term Goals: Week 1:  OT Short Term Goal 1 (Week 1): Pt will complete sit <>stand transfer with LRAD needing min assist in prep for self care. OT Short Term Goal 2 (Week 1): Pt will donn/doff pants with CGA. OT Short Term Goal 3 (Week 1): Pt will complete tub shower transfer with min assist. OT Short Term Goal 4 (Week 1): Pt will increase RUE MMT to 3+/5 to facilitate improved independence with functional transfers.  Skilled Therapeutic Interventions/Progress Updates:    Patient in bed, alert and ready for therapy session.  He notes pain under control at this time when in bed but he had significant neck pain earlier today that persisted most of the morning and he is hesitant to move too much but does not want to miss session.  Reviewed bed mobility and body mechanics, reviewed muscle imbalance and strategies for relaxation/inhibition.  Donns slipper socks in supine position with set up .   Able to move from supine to sitting edge of bed with min A and ongoing cues.  Completed seated posture, trunk and balance activities, completed standing weight shift and stepping activities - returned to sitting when right side flexor tightness increased and he began to feel increased neck tightness - again reviewed strategies for positioning and relaxation.  Inhibition and extensor facilitation activities for right arm in seated and supine positions with intermittent neck tightness.  Provided and reviewed theraputty, dycem and built up handle.  Able to hold and manipulate objects but becomes increasingly difficult with flexor tightness - reviewed extensor facilitation and flexor inhibition - would benefit from ongoing practice.   He  remained in bed at close of session due to neck pain and finding position of relief.  Bed alarm set and call bell in hand.     Therapy Documentation Precautions:  Precautions Precautions: Fall Precaution Comments: fall, slightly impulsive, right sided neglect Restrictions Weight Bearing Restrictions: No   Therapy/Group: Individual Therapy  Barrie Lyme 06/20/2021, 7:42 AM

## 2021-06-20 NOTE — Progress Notes (Signed)
Orthopedic Tech Progress Note Patient Details:  Christian Russell 1971-08-09 [id number]  Called in order to [employer] for a WRIST SPLINT and a PRAFO BOOT   Patient ID: Christian Russell, male   DOB: 09-03-1970, 50 y.o.   MRN: [id number]  Christian Russell 06/20/2021, 9:11 AM

## 2021-06-20 NOTE — Progress Notes (Signed)
Nurse called to patient room concerning patient expressing nausea. Checked patient formulary and patient does not have a medication for nausea. PA called  and new verbal order placed for PO Zofran. Patient was given PO Zofran and is now laying in the bed. Cletis Media, LPN

## 2021-06-21 NOTE — Progress Notes (Signed)
Occupational Therapy Session Note  Patient Details  Name: Christian Russell MRN: [id number] Date of Birth: 27-Sep-1970  Today's Date: 06/21/2021 OT Individual Time: 1345-1440 OT Individual Time Calculation (min): 55 min    Short Term Goals: Week 1:  OT Short Term Goal 1 (Week 1): Pt will complete sit <>stand transfer with LRAD needing min assist in prep for self care. OT Short Term Goal 2 (Week 1): Pt will donn/doff pants with CGA. OT Short Term Goal 3 (Week 1): Pt will complete tub shower transfer with min assist. OT Short Term Goal 4 (Week 1): Pt will increase RUE MMT to 3+/5 to facilitate improved independence with functional transfers.  Skilled Therapeutic Interventions/Progress Updates:    Pt semi reclined in bed, agreeable to OT session, no initial c/o pain however pt reporting soreness and burning in right neck and extremity intermittently during session.  Rest breaks allowed as needed and repositioning to address pain.  Pt completed supine to sit with supervision.  Squat pivot EOB to w/c with cues to attend to right side of body and min assist.  Transported to ortho gym.  Pt completed stand pivot towards affected side however moving quickly and heavily compensating using non affected leg without bearing any weight through RLE.  Educated pt on benefits of bearing weight and actively using RUE/RLE to increase motor recovery.  Instructed pt through constraint induced movement to encourage RUE functional use while sitting EOM.  Pt required max Vcs for attending, sequencing of movements to grasp horse shoe and toss at cone placed one foot away.  Positioned horse shoes and target to promote PNF patterns D1/D2 flexion/extension.  Pt required significantly increased time for motor processing but able to complete successful grasp and release without any manual assist.  Unable to successfully place fully on target all attempts but able able to get in close proximity.  Returned to room and completed squat  pivot w/c to EOB with improved attempt to bear weight through RLE during transfer needing min assist.  Call bell in reach, bed alarm on.  Therapy Documentation Precautions:  Precautions Precautions: Fall Precaution Comments: fall, slightly impulsive, right sided neglect Restrictions Weight Bearing Restrictions: No    Therapy/Group: Individual Therapy  Amie Critchley 06/21/2021, 2:55 PM

## 2021-06-21 NOTE — Progress Notes (Signed)
Pt requested grounds pass to go outside with family. Jesusita Oka, PA made aware. Grounds pass order received from [place].     Marylu Lund, RN

## 2021-06-21 NOTE — Progress Notes (Signed)
Occupational Therapy Note  Patient Details  Name: DMAURI ROSENOW MRN: [id number] Date of Birth: 10-04-1970   Today's Date: 06/21/2021 OT Missed Time: 60 Minutes Missed Time Reason: Patient unwilling/refused to participate without medical reason  Pt received in bed on phone. Pt agitated and upset with staff and food options. Pt stated he was not upset at therapy team - but with numerous other complaints. Pt refused therapy this AM, stating charge nurse would be by at 8:30 am and he would leave hospital today if issues not resolved. Pt apologized for not wanting to do therapy. Left in bed with call bell nearby.    Jady Braggs 06/21/2021, 7:50 AM

## 2021-06-21 NOTE — Progress Notes (Signed)
Speech Language Pathology Daily Session Note  Patient Details  Name: Christian Russell MRN: [id number] Date of Birth: March 08, 1971  Today's Date: 06/21/2021 SLP Individual Time: 0917-1000 SLP Individual Time Calculation (min): 43 min  Short Term Goals: Week 1: SLP Short Term Goal 1 (Week 1): Patient will maintain 90% intelligibility at conversational level with supervision A cues for decreasing speech rate. SLP Short Term Goal 2 (Week 1): Patient will demonstrate awareness to errors and potential errors during functional tasks and simulated funcitonal tasks, with minA verbal and visual cues. SLP Short Term Goal 3 (Week 1): Patient will recall specific information related to daily therapeutic and medical interventions with minA verbal, visual cues. SLP Short Term Goal 4 (Week 1): Patient will perform mildly complex functional problem solving tasks with approximately 80% accuracy and minA cues. SLP Short Term Goal 5 (Week 1): Patient will demonstrate selective attention during completion of functional tasks with minA verbal cues to redirect.  Skilled Therapeutic Interventions: Pt seen for skilled ST with focus on cognitive goals, pt in bed in the dark very upset and agitated with events of the morning. SLP listening to patient complaints and providing appropriate solutions and education, pt then agreeable to participate in skilled therapeutic tasks. SLP facilitating med management task by providing written list of current medications. Pt reports taking no medication at home and would like to be comfortable with recommended prescriptions. SLP facilitating simple problem solving task related to med organization by providing min A cues for 100% accuracy. Pt requesting pen and paper at bedside, SLP creating booklet for pt to write down questions and increase recall of important information. Pt left in bed with alarm set and all needs within reach. Cont ST POC.  Pain Pain Assessment Pain Scale: 0-10 Pain  Score: 0-No pain  Therapy/Group: Individual Therapy  Tacey Ruiz 06/21/2021, 9:55 AM

## 2021-06-21 NOTE — Progress Notes (Signed)
Physical Therapy Session Note  Patient Details  Name: Christian Russell MRN: [id number] Date of Birth: Feb 15, 1971  Today's Date: 06/21/2021 PT Individual Time: 1000-1055 PT Individual Time Calculation (min): 55 min   Short Term Goals: Week 1:  PT Short Term Goal 1 (Week 1): Pt will complete bed mobility with modA PT Short Term Goal 2 (Week 1): Pt will complete bed<>chair transfers with modA and LRAD PT Short Term Goal 3 (Week 1): Pt will ambulate 76ft with modA and LRAD PT Short Term Goal 4 (Week 1): Pt will improve pain management techniques to allow participation in therapy  Skilled Therapeutic Interventions/Progress Updates:     Pt seen supine in bed, talking on the phone to his mother. Pt asking her to bring in tennis shoes and other personal items today. He's agreeable to therapy but voices frustration in regards to the quality of his breakfast due to what he called, spoiled lettuce. RN and staff is aware of this. Avoided pain questioning to avoid pain focused behaviors during session. Supine<>sit completed with minA and heavy use of bed rail for upper body management during transfer. Able to sit unsupported at EOB with SBA. Completes lateral scoot transfer into w/c with minA, towards his weaker R side, with mod verbal cues for setup and sequencing. Pt requesting to brush his teeth prior to leaving his room - wheeled to sink in w/c and pt complete oral care without assist, including setup. He did require assistance for applying deodorant to his stronger L arm.  Pt transported from his room downstairs to 28M rehab gym for time.   Assisted to Nustep via stand<>pivot transfer with use of RW and min/modA - noted L ankle inverting significantly during transfer and required facilitation to netural to reduce risk of turning the ankle. Pt completed 8 minutes total of Nustep with use of Nustep WellGrip to paretic L hand - pt was able to grip but unable to maintain grip during task. Pt instructed to avoid  using his LUE to compensate during task, encouraging RLE and RUE involvement as much as possible.   Pt assisted back to w/c via minA squat<>pivot towards his stronger L side. Instructed in w/c propulsion where he required minA for propelling ~142ft in level hallways using hemi-technique. He demonstrated appropriate motor planning and technique but struggled due to short stature (pt is 5'4) and lacked adequate L foot contact to assist with steering. Pt then worked on seated there-ex for RLE strengthening. Applied 4# ankle weight and instructed on LAQ with emphasis on eccentric lowering and then hip marches in available range.   Pt returned upstairs to his room and then assisted back to bed via squat<>pivot transfer with minA. RN entering room for pain medications. Handoff of care to RN. Bed alarm on. All needs in reach.    Therapy Documentation Precautions:  Precautions Precautions: Fall Precaution Comments: fall, slightly impulsive, right sided neglect Restrictions Weight Bearing Restrictions: No General:    Therapy/Group: Individual Therapy  Orrin Brigham 06/21/2021, 7:35 AM

## 2021-06-21 NOTE — Progress Notes (Signed)
PROGRESS NOTE   Subjective/Complaints: Complaints that foods was spoiled and he felt nauseous afterward- received nausea medicine and is now feeling better and hopeful he can tolerate his next therapy session  ROS: denies insomnia, constipation, + low back pain, +lower extremity spasms, +nausea   Objective:   No results found. No results for input(s): WBC, HGB, HCT, PLT in the last 72 hours.  No results for input(s): NA, K, CL, CO2, GLUCOSE, BUN, CREATININE, CALCIUM in the last 72 hours.   Intake/Output Summary (Last 24 hours) at 06/21/2021 1017 Last data filed at 06/21/2021 0900 Gross per 24 hour  Intake 820 ml  Output 2180 ml  Net -1360 ml        Physical Exam: Vital Signs Blood pressure 114/83, pulse 88, temperature 98.7 F (37.1 C), resp. rate 14, height 5\' 4"  (1.626 m), SpO2 97 %. Gen: no distress, normal appearing HEENT: oral mucosa pink and moist, NCAT Cardio: Reg rate Chest: normal effort, normal rate of breathing Abd: soft, non-distended Ext: no edema Psych: pleasant, normal affect Skin: intact Neuro: Alert and oriented x3 Musculoskeletal: Right sided strength 2/5 EF and EE, 3/5 HF and KE, 0/5 WE and DF   Assessment/Plan: 1. Functional deficits which require 3+ hours per day of interdisciplinary therapy in a comprehensive inpatient rehab setting. Physiatrist is providing close team supervision and 24 hour management of active medical problems listed below. Physiatrist and rehab team continue to assess barriers to discharge/monitor patient progress toward functional and medical goals  Care Tool:  Bathing              Bathing assist Assist Level: Moderate Assistance - Patient 50 - 74%     Upper Body Dressing/Undressing Upper body dressing        Upper body assist Assist Level: Minimal Assistance - Patient > 75%    Lower Body Dressing/Undressing Lower body dressing            Lower  body assist Assist for lower body dressing: Moderate Assistance - Patient 50 - 74%     Toileting Toileting    Toileting assist Assist for toileting: Maximal Assistance - Patient 25 - 49%     Transfers Chair/bed transfer  Transfers assist     Chair/bed transfer assist level: Maximal Assistance - Patient 25 - 49%     Locomotion Ambulation   Ambulation assist      Assist level: Maximal Assistance - Patient 25 - 49% Assistive device: Parallel bars Max distance: 8   Walk 10 feet activity   Assist  Walk 10 feet activity did not occur: Safety/medical concerns        Walk 50 feet activity   Assist Walk 50 feet with 2 turns activity did not occur: Safety/medical concerns         Walk 150 feet activity   Assist Walk 150 feet activity did not occur: Safety/medical concerns         Walk 10 feet on uneven surface  activity   Assist Walk 10 feet on uneven surfaces activity did not occur: Safety/medical concerns         Wheelchair     Assist Is the patient using  a wheelchair?: Yes Type of Wheelchair: Manual    Wheelchair assist level: Dependent - Patient 0% Max wheelchair distance: 150    Wheelchair 50 feet with 2 turns activity    Assist        Assist Level: Dependent - Patient 0%   Wheelchair 150 feet activity     Assist      Assist Level: Dependent - Patient 0%   Blood pressure 114/83, pulse 88, temperature 98.7 F (37.1 C), resp. rate 14, height 5\' 4"  (1.626 m), SpO2 97 %.  Medical Problem List and Plan: 1.  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  Antithrombotics: -DVT/anticoagulation:  Mechanical:  Antiembolism stockings, knee (TED hose) Bilateral lower extremities              -antiplatelet therapy: N/A 3. Pain: Continue Oxycodone 10mg  q6H prn- was upset when decreased. Robaxin-750 milligram every 6 hours as needed muscle spasms. Add kpad for lowe back 4. Mood/sleep: Team to provide emotional support.  Melatonin 3 mg nightly as well as Restoril.               -antipsychotic agents: N/A             -pt has worked 3rd shift for 20 years and finds it difficult to sleep at night                         -continue above meds and observe 5. Neuropsych: This patient is capable of making decisions on his own behalf. 6. Skin/Wound Care: Routine skin checks 7. Fluids/Electrolytes/Nutrition: Routine in and outs with follow-up chemistries 8.  Hypertension. Monitor with increased mobility  D/c norvasc- BP remains excellent 9.  Hyperlipidemia.  TG and LDL elevated. Continue Lipitor 10.  Urine drug screen positive marijuana.  Provide counseling 11. Hyperglycemia: educated to avoid drinks and foods with added sugar.  12. Lower extremity spasms: Continue baclofen 5mg  at night- helped.  13. Therapeutic grounds pass to see friends and dog on grounds for his birthday today 20. Agitation regarding food/episode where he could not use bedside commode: comforted and concerns discussed with  LOS: 5 days A FACE TO FACE EVALUATION WAS PERFORMED  P Loron Weimer 06/21/2021, 10:17 AM

## 2021-06-22 MED ORDER — DIPHENHYDRAMINE HCL 12.5 MG/5ML PO ELIX
25.0000 mg | ORAL_SOLUTION | Freq: Once | ORAL | Status: AC
  Administered 2021-06-22: 25 mg via ORAL
  Filled 2021-06-22: qty 10

## 2021-06-22 NOTE — Progress Notes (Signed)
Physical Therapy Session Note  Patient Details  Name: Christian Russell MRN: [id number] Date of Birth: [birth year]  Today's Date: 06/22/2021 PT Individual Time: 7253-6644 + 1500-1515 PT Individual Time Calculation (min): 58 min + 15 min  Short Term Goals: Week 1:  PT Short Term Goal 1 (Week 1): Pt will complete bed mobility with modA PT Short Term Goal 2 (Week 1): Pt will complete bed<>chair transfers with modA and LRAD PT Short Term Goal 3 (Week 1): Pt will ambulate 52ft with modA and LRAD PT Short Term Goal 4 (Week 1): Pt will improve pain management techniques to allow participation in therapy  Skilled Therapeutic Interventions/Progress Updates:     1st session: Pt seen supine in bed to start session - he's agreeable to therapy but is waiting on his morning medications, which includes his tylenol for pain. RN arriving shortly to provide these. Pt completed supine<>sit with supervision with heavy reliance of LUE on bed rail to assist. He removes hospital socks without assist while seated EOB via figure-4 technique. He don's regular socks in similar manner. Donned tennis shoes with totalA for time at EOB and then completes stand<>pivot transfer with modA from EOB to w/c, towards weaker R side. Pt requesting to brush his teeth prior to leaving to rehab gym. He completes oral care without assistance but requires assistance for applying deodorant to L arm. Pt transported downstairs to 14M rehab gym for time. Completed stand<>pivot transfer from w/c to mat table, towards stronger L side, with minA. Completed active warm-up of repeated sit<>stands to RW with CGA - cues for increasing RLE weight bearing due to unequal weight shift with bias towards L side. Next, worked on Investment banker, operational with RW on level ground while wearing the tennis shoes to aid in ankle stability. He ambulated ~53ft with minA and RW - however, he continues to demonstrate significant R ankle instability with high risk of inversion during  stance. MD arriving for morning rounds and discussed ankle instability with possibility of bracing with lateral air cast vs AFO.   Retrieved EMPI for NMES, targeting small muscle atrophy with small pads placed at ankle DF, bias towards the peroneals to facilitate ankle DF + ankle Eversion to strengthen and activate to follow through during gait. Used pre-set settings on EMPI, completed for 10 minutes with RLE propped on stool to isolate muscle groups. Settings at 39 workload. Encouraged active ankle DF during on-time.   Pt assisted back to his w/c via squat<>pivot with minA transfer and then transported upstairs back to his room for time. Assisted to bed in similar manner and pt able to complete sit>supine with supervision with [facility] raised and use of bed rail. Bed alarm on, all immediate needs in reach.   2nd session: Pt seen supine in bed at start of session. He voices frustration over prior OT session, his chronic neck/back pain, and other various concerns related to his care during his hospitalization. Attempted redirection, therapeutic listening, and education in all topics - limited receptiveness. Allowed patient to rest due to these reasons and will reattempt as able. Pt missed 15 minutes of skilled therapy.   Therapy Documentation Precautions:  Precautions Precautions: Fall Precaution Comments: fall, slightly impulsive, right sided neglect Restrictions Weight Bearing Restrictions: No General: PT Amount of Missed Time (min): 15 Minutes PT Missed Treatment Reason: Other (Comment) (agitation)  Therapy/Group: Individual Therapy  Orrin Brigham 06/22/2021, 7:36 AM

## 2021-06-22 NOTE — Progress Notes (Addendum)
Patient ID: Christian Russell, male   DOB: 1970-08-31, 50 y.o.   MRN: [id number]  Met with pt to discuss team conference goals of min and hopefully upgrade to supervision level and target discharge date 11/11. 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 He is trying to participate and work on therapies, but has pain issues also. He is in agreement with the discharge date he does not want to burden his Mom. She is in good health and active for her age of 23. Will work on discharge needs and ask neuro-psych to see for coping while here. Attempted to call Mom but her voicemail box was full. Will continue to try to reach her, she does visit every other day

## 2021-06-22 NOTE — Progress Notes (Signed)
Occupational Therapy Session Note  Patient Details  Name: Christian Russell MRN: [id number] Date of Birth: 1970/09/24  Today's Date: 06/22/2021 OT Individual Time: 1330-1415 OT Individual Time Calculation (min): 45 min    Short Term Goals: Week 1:  OT Short Term Goal 1 (Week 1): Pt will complete sit <>stand transfer with LRAD needing min assist in prep for self care. OT Short Term Goal 2 (Week 1): Pt will donn/doff pants with CGA. OT Short Term Goal 3 (Week 1): Pt will complete tub shower transfer with min assist. OT Short Term Goal 4 (Week 1): Pt will increase RUE MMT to 3+/5 to facilitate improved independence with functional transfers.  Skilled Therapeutic Interventions/Progress Updates:    Pt semi reclined, c/o 8/10 pain left neck superior aspect, also reporting frustration with lunch "getting messed up again" and desire to eat.  Therapist retrieved small hot packs and allowed pt extra time to finish lunch per his request.  When therapist returned, noted pt eating finger foods with LUE only and RUE resting under table.  Encouraged pt to incorporate RUE while self feeding as able.  Pt responding with frustration stating that he just doesn't want to struggle while he eats.  Therapist validated pt feelings and allowed for pt to use LUE for remainder of meal.  While feeding, applied hot packs to bilateral neck.  Provision of soft tissue massage and trigger point release to bilateral upper traps, scalenes, and levator scapulae.  Educated pt on upper traps self stretch for left side and pt completed 3 reps x 30 seconds each.  Pt requesting to try on air cast that arrived recently.  Pt completed supine to sit EOB with supervision.  Total assist to donn aircast and noted good fit.  Total assist to donn shoe also with good fit.  Asked pt if he would like to participate in functional transfer training utilizing aircast, however pt reporting his neck is still hurting and wants to return to bed.  Pt returned to  semi-reclined in bed with supervision, call bell in reach, bed alarm on.  Pt missed 15 minutes of treatment due to refusal and pain.  Nurse made aware of pts pain level and location.  Therapy Documentation Precautions:  Precautions Precautions: Fall Precaution Comments: fall, slightly impulsive, right sided neglect Restrictions Weight Bearing Restrictions: No   Therapy/Group: Individual Therapy  Amie Critchley 06/22/2021, 3:21 PM

## 2021-06-22 NOTE — Progress Notes (Signed)
PROGRESS NOTE   Subjective/Complaints: Ambulating 25 feet MinA with walker- Christian notes ankle instability so aircast ordered. E-stim applied today with good muscle activation  ROS: denies insomnia, constipation, + low back pain, +lower extremity spasms, +nausea, +rolling ankle with therapy   Objective:   No results found. No results for input(s): WBC, HGB, HCT, PLT in the last 72 hours.  No results for input(s): NA, K, CL, CO2, GLUCOSE, BUN, CREATININE, CALCIUM in the last 72 hours.   Intake/Output Summary (Last 24 hours) at 06/22/2021 1105 Last data filed at 06/22/2021 0700 Gross per 24 hour  Intake 960 ml  Output 1700 ml  Net -740 ml        Physical Exam: Vital Signs Blood pressure 106/80, pulse 77, temperature 97.6 F (36.4 C), resp. rate 18, height 5\' 4"  (1.626 m), SpO2 98 %. Gen: no distress, normal appearing HEENT: oral mucosa pink and moist, NCAT Cardio: Reg rate Chest: normal effort, normal rate of breathing Abd: soft, non-distended Ext: no edema Psych: pleasant, normal affect Skin: intact Neuro: Alert and oriented x3 Musculoskeletal: Right sided strength 2/5 EF and EE, 3/5 HF and KE, 0/5 WE and DF   Assessment/Plan: 1. Functional deficits which require 3+ hours per day of interdisciplinary therapy in a comprehensive inpatient rehab setting. Physiatrist is providing close team supervision and 24 hour management of active medical problems listed below. Physiatrist and rehab team continue to assess barriers to discharge/monitor patient progress toward functional and medical goals  Care Tool:  Bathing              Bathing assist Assist Level: Moderate Assistance - Patient 50 - 74%     Upper Body Dressing/Undressing Upper body dressing        Upper body assist Assist Level: Minimal Assistance - Patient > 75%    Lower Body Dressing/Undressing Lower body dressing            Lower  body assist Assist for lower body dressing: Moderate Assistance - Patient 50 - 74%     Toileting Toileting    Toileting assist Assist for toileting: Maximal Assistance - Patient 25 - 49%     Transfers Chair/bed transfer  Transfers assist     Chair/bed transfer assist level: Maximal Assistance - Patient 25 - 49%     Locomotion Ambulation   Ambulation assist      Assist level: Maximal Assistance - Patient 25 - 49% Assistive device: Parallel bars Max distance: 8   Walk 10 feet activity   Assist  Walk 10 feet activity did not occur: Safety/medical concerns        Walk 50 feet activity   Assist Walk 50 feet with 2 turns activity did not occur: Safety/medical concerns         Walk 150 feet activity   Assist Walk 150 feet activity did not occur: Safety/medical concerns         Walk 10 feet on uneven surface  activity   Assist Walk 10 feet on uneven surfaces activity did not occur: Safety/medical concerns         Wheelchair     Assist Is the patient using a wheelchair?: Yes  Type of Wheelchair: Manual    Wheelchair assist level: Dependent - Patient 0% Max wheelchair distance: 150    Wheelchair 50 feet with 2 turns activity    Assist        Assist Level: Dependent - Patient 0%   Wheelchair 150 feet activity     Assist      Assist Level: Dependent - Patient 0%   Blood pressure 106/80, pulse 77, temperature 97.6 F (36.4 C), resp. rate 18, height 5\' 4"  (1.626 m), SpO2 98 %.  Medical Problem List and Plan: 1.  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  Antithrombotics: -DVT/anticoagulation:  Mechanical:  Antiembolism stockings, knee (TED hose)  Bilateral lower extremities             -antiplatelet therapy: N/A 3. Pain: Continue Oxycodone 10mg  q6H prn- was upset when decreased. Robaxin-750 milligram every 6 hours as needed muscle spasms. Continue kpad for lower back and neck pain 4. Mood/sleep: Team to provide emotional support.  Melatonin 3 mg nightly as well as Restoril.               -antipsychotic agents: N/A             -pt has worked 3rd shift for 20 years and finds it difficult to sleep at night                         -continue above meds and observe 5. Neuropsych: This patient is capable of making decisions on his own behalf. 6. Skin/Wound Care: Routine skin checks 7. Fluids/Electrolytes/Nutrition: Routine in and outs with follow-up chemistries 8.  Hypertension. Monitor with increased mobility  D/c norvasc- BP remains excellent 9.  Hyperlipidemia.  TG and LDL elevated. Continue Lipitor 10.  Urine drug screen positive marijuana.  Provide counseling 11. Hyperglycemia: educated to avoid drinks and foods with added sugar.  12. Lower extremity spasms: Continue baclofen 5mg  at night- helped.  13. Therapeutic grounds pass to see friends and dog on grounds for his birthday today 32. Agitation regarding food/episode where he could not use bedside commode: comforted and concerns discussed with 15. Right ankle weakness/rolling: aircast ordered.   LOS: 6 days A FACE TO FACE EVALUATION WAS PERFORMED  Kiernan Atkerson P Mahayla Haddaway 06/22/2021, 11:05 AM

## 2021-06-22 NOTE — Patient Care Conference (Signed)
Inpatient RehabilitationTeam Conference and Plan of Care Update Date: 06/22/2021   Time: 11:03 AM    Patient Name: Christian Russell      Medical Record Number: [id number]  Date of Birth: February 02, 1971 Sex: Male         Room/Bed: 5C09C/5C09C-01 Payor Info: Payor: BLUE [employer] BLUE [employer]IELD / Plan: BCBS COMM PPO / Product Type: *No Product type* /    Admit Date/Time:  06/16/2021  3:52 PM  Primary Diagnosis:  ICH (intracerebral hemorrhage) [facility])  Hospital Problems: Principal Problem:   ICH (intracerebral hemorrhage) [facility])    Expected Discharge Date: Expected Discharge Date: 07/08/21  Team Members Present: Physician leading conference: Dr. Sula Soda Social Worker Present: Dossie Der, LCSW Nurse Present: Chana Bode, RN PT Present: Wynelle Link, PT OT Present: Dolphus Jenny, OT SLP Present: Elio Forget, SLP     Current Status/Progress Goal Weekly Team Focus  Bowel/Bladder     Continent of B+B   Remain continent     Swallow/Nutrition/ Hydration             ADL's   min-modA functional transfers; min A for self care  mod I-sup  neuro re-ed RUE/RLE, functional transfer training, self care training/hemi strategies, safety awareness   Mobility   minA for bed mobility, modA for squat<>pivot transfers to his weaker R side, minA for transfers to his stronger L side. MinA for sit<>stand transfers to RW. Ambulating ~62ft with modA and RW. L foot inverting during gait and stance phase - pt bringing in shoes. Pain limiting at times, inconsistent  minA - may upgrade to supervision  Bed mobility, functional transfers, standing balance, RLE NMR, gait training with RLAD, pain management   Communication             Safety/Cognition/ Behavioral Observations            Pain     Pain all over   Pain managed with prn meds   Assess need for and effectiveness of prn, schedule meds and alternative measures  Skin     N/A          Discharge Planning:  Home with Mom who is able to provide  supervision-light min but has health issues of her own. Hopefully pt's goals can be upgraded due to progress here   Team Discussion: Pain limiting progress and participation in therapy;addressed, and k-pad added. Right side weakness compensated for by patient. Patient can be tangential and easily distracted. Patient on target to meet rehab goals: Yes; working on attending to right side, and awareness. Currently stand pivot with mod Assist. Needs min assist for ambulation up to 20' with a RW.   *See Care Plan and progress notes for long and short-term goals.   Revisions to Treatment Plan:  Constraint reduced therapy for right side weakness Air cast for right ankle rolling Neuromuscular re-education; E-stim Upgraded goals to supervision overall  Teaching Needs: Safety, medications, secondary risk management, transfers, etc  Current Barriers to Discharge: Decreased caregiver support, Home enviroment access/layout, and Behavior  Possible Resolutions to Barriers: Family education with mother     Medical Summary Current Status: ankle rolling while walking, neck and back pain, right upper extremity weakness, clonus, insomnia  Barriers to Discharge: Medical stability  Barriers to Discharge Comments: ankle rolling while walking, neck and back pain, right upper extremity weakness, clonus, insomnia Possible Resolutions to [employer] Focus: air cast ordered, continue oxycodone, offer kpad between sessions, PRAFO and WHO ordered, baclofen started at night   Continued  Need for Acute Rehabilitation Level of Care: The patient requires daily medical management by a physician with specialized training in physical medicine and rehabilitation for the following reasons: Direction of a multidisciplinary physical rehabilitation program to maximize functional independence : Yes Medical management of patient stability for increased activity during participation in an intensive rehabilitation regime.:  Yes Analysis of laboratory values and/or radiology reports with any subsequent need for medication adjustment and/or medical intervention. : Yes   I attest that I was present, lead the team conference, and concur with the assessment and plan of the team.   Chana Bode B 06/22/2021, 4:55 PM

## 2021-06-22 NOTE — Progress Notes (Signed)
Speech Language Pathology Daily Session Note  Patient Details  Name: Christian Russell MRN: [id number] Date of Birth: 01-22-1971  Today's Date: 06/22/2021 SLP Individual Time: 8182-9937 SLP Individual Time Calculation (min): 50 min  Short Term Goals: Week 1: SLP Short Term Goal 1 (Week 1): Patient will maintain 90% intelligibility at conversational level with supervision A cues for decreasing speech rate. SLP Short Term Goal 2 (Week 1): Patient will demonstrate awareness to errors and potential errors during functional tasks and simulated funcitonal tasks, with minA verbal and visual cues. SLP Short Term Goal 3 (Week 1): Patient will recall specific information related to daily therapeutic and medical interventions with minA verbal, visual cues. SLP Short Term Goal 4 (Week 1): Patient will perform mildly complex functional problem solving tasks with approximately 80% accuracy and minA cues. SLP Short Term Goal 5 (Week 1): Patient will demonstrate selective attention during completion of functional tasks with minA verbal cues to redirect.  Skilled Therapeutic Interventions:   Patient seen for skilled ST session focusing on cognitive function goals. His speech intelligibility was 90-100% at conversational level without cues. Patient reported that he did not sleep very well last night but when he was working night shift he typically did not sleep much at all day or night. SLP guided patient through review of stroke information packet with patient asking appropriate questions but also perseverative and tangential especially related to medications as MD had made some recent changes with his pain medications. He required frequent cues to redirect to topic/task. He was receptive to learning about stroke and SLP plans to continue this with patient. He continues to benefit from skilled SLP intervention to maximize cognitive and speech goals prior to discharge.  Pain Pain Assessment Pain Scale: 0-10 Pain  Score: 0-No pain Faces Pain Scale: Hurts a little bit Pain Type: Chronic pain Pain Location: Shoulder Pain Orientation: Mid Pain Radiating Towards: lower back Pain Descriptors / Indicators: Aching;Discomfort Pain Frequency: Constant Pain Onset: On-going Patients Stated Pain Goal: 3 Pain Intervention(s): Medication (See eMAR) Multiple Pain Sites: No  Therapy/Group: Individual Therapy  Angela Nevin, MA, CCC-SLP Speech Therapy

## 2021-06-22 NOTE — Progress Notes (Signed)
Orthopedic Tech Progress Note Patient Details:  Christian Russell July 22, 1971 [id number]  Ortho Devices Type of Ortho Device: Ankle Air splint Ortho Device/Splint Location: RLE Ortho Device/Splint Interventions: Ordered   Post Interventions Patient Tolerated: Well Instructions Provided: Care of device  [name] 06/22/2021, 11:22 AM

## 2021-06-23 ENCOUNTER — Inpatient Hospital Stay (HOSPITAL_COMMUNITY): Payer: BC Managed Care – PPO

## 2021-06-23 MED ORDER — DICLOFENAC SODIUM 1 % EX GEL
2.0000 g | Freq: Four times a day (QID) | CUTANEOUS | Status: DC
  Administered 2021-06-23 – 2021-06-30 (×22): 2 g via TOPICAL
  Filled 2021-06-23 (×2): qty 100

## 2021-06-23 NOTE — Progress Notes (Signed)
Physical Therapy Session Note  Patient Details  Name: Christian Russell MRN: [id number] Date of Birth: 1970-09-14  Today's Date: 06/23/2021 PT Individual Time: 0900-0955 PT Individual Time Calculation (min): 55 min   Short Term Goals: Week 1:  PT Short Term Goal 1 (Week 1): Pt will complete bed mobility with modA PT Short Term Goal 2 (Week 1): Pt will complete bed<>chair transfers with modA and LRAD PT Short Term Goal 3 (Week 1): Pt will ambulate 35ft with modA and LRAD PT Short Term Goal 4 (Week 1): Pt will improve pain management techniques to allow participation in therapy  Skilled Therapeutic Interventions/Progress Updates:     Pt seen sitting EOB at start of session - agreeable to PT treatment. Deferred asking pain questions to avoid pain focused behaviors. Pt donned socks with setupA via figure-4 technique while seated EOB. Donned AIRCAST to R ankle and both tennis shoes with totalA for time. Completed squat<>pivot transfer with modA, towards weaker R side.   Pt requesting to brush teeth at sink - wheeled sinkside and pt completed oral care without assistance - encouraged him to try to use his paretic R arm as much as possible. He required assist for applying deodorant to L underarm. Pt donned a clean t-shirt with setupA and then pt was transported downstairs to 32M rehab gym for time.   Sit<>stand to RW from w/c height with minA and hand-over-hand assist for maintaining grip to RW (with RW orthotic). Ambulated 85ft with minA and RW with AIRCAST to RLE - noted significantly improved ankle stability without evidence of inversion. Gait deficits include step-to gait pattern, decreased R foot clearance, R toe drag, and inconsistent step length with RLE. No evidence of true knee buckling however increased unsteadiness and difficulty with turns to sit.   Applied FOOT-UP brace to RLE with combination of AIRCAST. He ambulated 65ft + 194ft with minA and RW with similar deficits as above but improved  foot clearance and less toe drag. VC for increased heel strike on contact with R foot.   Pt transported back upstairs to his room - assisted to EOB via minA squat<>pivot transfer towards his stronger L side. Removed foot-up brace and pt requesting to remain seated EOB at end of session. Bed alarm on, all needs in reach. Pleased with progress made today.   Therapy Documentation Precautions:  Precautions Precautions: Fall Precaution Comments: fall, slightly impulsive, right sided neglect Restrictions Weight Bearing Restrictions: No General:    Therapy/Group: Individual Therapy  Orrin Brigham 06/23/2021, 7:41 AM

## 2021-06-23 NOTE — Progress Notes (Signed)
Patient ID: Christian Russell, male   DOB: [birth year], 50 y.o.   MRN: [id number]  Did reach Mom via telephone to update her on team conference goals and target discharge date of 11/11. She is pleased with this and willing to come in for education prior to discharge. She voiced he has worked third shift for twenty years and is not used to being up during the day. He is still trying to adjust will ask team to adjust his therapy schedule. Work on discharge needs.
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
## 2021-06-23 NOTE — Progress Notes (Signed)
Speech Language Pathology Weekly Progress and Session Note  Patient Details  Name: Christian Russell MRN: [id number] Date of Birth: [birth year]  Beginning of progress report period: June 17, 2021 End of progress report period: June 24, 2021  Short Term Goals: Week 1: SLP Short Term Goal 1 (Week 1): Patient will maintain 90% intelligibility at conversational level with supervision A cues for decreasing speech rate. SLP Short Term Goal 1 - Progress (Week 1): Met SLP Short Term Goal 2 (Week 1): Patient will demonstrate awareness to errors and potential errors during functional tasks and simulated funcitonal tasks, with minA verbal and visual cues. SLP Short Term Goal 2 - Progress (Week 1): Met SLP Short Term Goal 3 (Week 1): Patient will recall specific information related to daily therapeutic and medical interventions with minA verbal, visual cues. SLP Short Term Goal 3 - Progress (Week 1): Met SLP Short Term Goal 4 (Week 1): Patient will perform mildly complex functional problem solving tasks with approximately 80% accuracy and minA cues. SLP Short Term Goal 4 - Progress (Week 1): Met SLP Short Term Goal 5 (Week 1): Patient will demonstrate selective attention during completion of functional tasks with minA verbal cues to redirect. SLP Short Term Goal 5 - Progress (Week 1): Not met  New Short Term Goals: Week 2: SLP Short Term Goal 1 (Week 2): Patient will demonstrate awareness to errors and potential errors during functional tasks and simulated funcitonal tasks, with supA verbal and visual cues. SLP Short Term Goal 2 (Week 2): Patient will recall specific information related to daily therapeutic and medical interventions with supA cues for use of compensatory strategies SLP Short Term Goal 3 (Week 2): Patient will perform mildly complex functional problem solving tasks with sup A verbal cues SLP Short Term Goal 4 (Week 2): Patient will demonstrate selective attention during completion of  functional tasks with minA verbal cues to redirect.  Weekly Progress Updates: Patient has made functional progress with ST over the past week and has met 4 of 5 short term goals this reporting period due to improved speech intelligibility, problem solving, and intellectual awareness. Patient is currently completing cognitive-linguistic tasks with min A verbal and visual cues. Patient continues to benefit from verbal redirection for sustained attention due to talkative nature and tendency to perseverate on certain topics, particularly related to ongoing pain/discomfort which is being managed by medical team. Patient/family education is ongoing. Recommend continued skilled ST intervention to maximize cognitive function and functional independence prior to discharge. Continue progression toward established LTGs set at mod I level overall.    Intensity: Minumum of 1-2 x/day, 30 to 90 minutes Frequency: 3 to 5 out of 7 days Duration/Length of Stay: 11/11 Treatment/Interventions: Cognitive remediation/compensation;Speech/Language facilitation;Medication managment;Environmental controls;Patient/family education;Functional tasks  Patty Sermons 06/23/2021, 4:40 PM

## 2021-06-23 NOTE — Progress Notes (Signed)
PROGRESS NOTE   Subjective/Complaints: Patient did not like having to use his right hand while eating foods- discussed that this is a part of our standard practice to help improve right arm strength and function but he prefers not to have to do this while eating  ROS: denies insomnia, constipation, + low back pain, +lower extremity spasms, +nausea, +rolling ankle with therapy, +can be easily agitated as per therapy   Objective:   No results found. No results for input(s): WBC, HGB, HCT, PLT in the last 72 hours.  No results for input(s): NA, K, CL, CO2, GLUCOSE, BUN, CREATININE, CALCIUM in the last 72 hours.   Intake/Output Summary (Last 24 hours) at 06/23/2021 1038 Last data filed at 06/23/2021 0524 Gross per 24 hour  Intake 280 ml  Output 1500 ml  Net -1220 ml        Physical Exam: Vital Signs Blood pressure 122/79, pulse 79, temperature 97.8 F (36.6 C), temperature source Oral, resp. rate 16, height 5\' 4"  (1.626 m), weight 68.9 kg, SpO2 97 %. Gen: no distress, normal appearing HEENT: oral mucosa pink and moist, NCAT Cardio: Reg rate Chest: normal effort, normal rate of breathing Abd: soft, non-distended Ext: no edema Psych: can get easily agitated with different staff members, usually very pleasant Skin: intact Neuro: Alert and oriented x3 Musculoskeletal: Right sided strength 2/5 EF and EE, 3/5 HF and KE, 0/5 WE and DF   Assessment/Plan: 1. Functional deficits which require 3+ hours per day of interdisciplinary therapy in a comprehensive inpatient rehab setting. Physiatrist is providing close team supervision and 24 hour management of active medical problems listed below. Physiatrist and rehab team continue to assess barriers to discharge/monitor patient progress toward functional and medical goals  Care Tool:  Bathing              Bathing assist Assist Level: Moderate Assistance - Patient 50 - 74%      Upper Body Dressing/Undressing Upper body dressing        Upper body assist Assist Level: Minimal Assistance - Patient > 75%    Lower Body Dressing/Undressing Lower body dressing            Lower body assist Assist for lower body dressing: Moderate Assistance - Patient 50 - 74%     Toileting Toileting    Toileting assist Assist for toileting: Maximal Assistance - Patient 25 - 49%     Transfers Chair/bed transfer  Transfers assist     Chair/bed transfer assist level: Maximal Assistance - Patient 25 - 49%     Locomotion Ambulation   Ambulation assist      Assist level: Maximal Assistance - Patient 25 - 49% Assistive device: Parallel bars Max distance: 8   Walk 10 feet activity   Assist  Walk 10 feet activity did not occur: Safety/medical concerns        Walk 50 feet activity   Assist Walk 50 feet with 2 turns activity did not occur: Safety/medical concerns         Walk 150 feet activity   Assist Walk 150 feet activity did not occur: Safety/medical concerns         Walk  10 feet on uneven surface  activity   Assist Walk 10 feet on uneven surfaces activity did not occur: Safety/medical concerns         Wheelchair     Assist Is the patient using a wheelchair?: Yes Type of Wheelchair: Manual    Wheelchair assist level: Dependent - Patient 0% Max wheelchair distance: 150    Wheelchair 50 feet with 2 turns activity    Assist        Assist Level: Dependent - Patient 0%   Wheelchair 150 feet activity     Assist      Assist Level: Dependent - Patient 0%   Blood pressure 122/79, pulse 79, temperature 97.8 F (36.6 C), temperature source Oral, resp. rate 16, height 5\' 4"  (1.626 m), weight 68.9 kg, SpO2 97 %.  Medical Problem List and Plan: 1.  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  Antithrombotics: -DVT/anticoagulation:  Mechanical:  Antiembolism stockings, knee (TED hose) Bilateral lower extremities             -antiplatelet therapy: N/A 3. Pain: Continue Oxycodone 10mg  q6H prn- was upset when decreased. Robaxin-750 milligram every 6 hours as needed muscle spasms. Continue kpad for lower back and neck pain 4. Insomnia: Team to provide emotional support.  Continue Melatonin 3 mg nightly as well as Restoril.               -antipsychotic agents: N/A             -pt has worked 3rd shift for 20 years and finds it difficult to sleep at night                         -continue above meds and observe 5. Neuropsych: This patient is capable of making decisions on his own behalf. 6. Skin/Wound Care: Routine skin checks 7. Fluids/Electrolytes/Nutrition: Routine in and outs with follow-up chemistries 8.  Hypertension. Monitor with increased mobility  D/c norvasc- BP remains excellent 9.  Hyperlipidemia.  TG and LDL elevated. Continue Lipitor 10.  Urine drug screen positive marijuana.  Provide counseling 11. Hyperglycemia: educated to avoid drinks and foods with added sugar.  12. Lower extremity spasms: Continue baclofen 5mg  at night- helped.  13. Therapeutic grounds pass to see friends and dog on grounds for his [birth year] today 59. Agitation regarding food/episode where he could not use bedside commode, being asked to use right hand while eating, decrease in pain medication: comforted and concerns discussed with staff 15. Right ankle weakness/rolling: aircast ordered.   LOS: 7 days A FACE TO FACE EVALUATION WAS PERFORMED  Blakelynn Scheeler 06/23/2021, 10:38 AM

## 2021-06-24 NOTE — Progress Notes (Addendum)
Occupational Therapy Weekly Progress Note  Patient Details  Name: Christian Russell MRN: [id number] Date of Birth: [birth year]  Beginning of progress report period: June 24, 2021 End of progress report period: June 24, 2021  Today's Date: 06/24/2021 OT Individual Time: 1100-1145 OT Individual Time Calculation (min): 45 min    Patient has met 3 of 4 short term goals.  Pt is progressing steadily towards goals and exhibits good ability to compensate for deficits.  Pt is able to completed UB self care in seated position with setup which is improved from overall min assist on initial evaluation.  Pt also able to complete LB bathing and dressing with CGA-supervision and functional sit<>stand and stand pivot transfers with CGA-min assist now.  Pt does exhibit moderate neglect to right side and still needs min to mod cueing for safety and incorporation of RUE/RLE during self care and functional mobility.  Low frustration tolerance, mild awareness of own deficits, mild safety awareness impairments, and significant internal distractibility are barriers to progress during skilled OT.  Pt requires skilled OT to ensure safe dc to home setting through restoration, education, and compensation training.    Patient continues to demonstrate the following deficits: muscle weakness, decreased cardiorespiratoy endurance, abnormal tone, motor apraxia, ataxia, decreased coordination, and decreased motor planning, decreased initiation, decreased attention, decreased awareness, decreased problem solving, and decreased safety awareness, and decreased sitting balance, decreased standing balance, hemiplegia, and decreased balance strategies and therefore will continue to benefit from skilled OT intervention to enhance overall performance with BADL.  Patient progressing toward long term goals..  Continue plan of care.  OT Short Term Goals Week 1:  OT Short Term Goal 1 (Week 1): Pt will complete sit <>stand transfer with LRAD  needing min assist in prep for self care. OT Short Term Goal 1 - Progress (Week 1): Met OT Short Term Goal 2 (Week 1): Pt will donn/doff pants with CGA. OT Short Term Goal 2 - Progress (Week 1): Met OT Short Term Goal 3 (Week 1): Pt will complete tub shower transfer with min assist. OT Short Term Goal 3 - Progress (Week 1): Met OT Short Term Goal 4 (Week 1): Pt will increase RUE MMT to 3+/5 to facilitate improved independence with functional transfers. OT Short Term Goal 4 - Progress (Week 1): Revised due to lack of progress Week 2:  OT Short Term Goal 1 (Week 2): Pt will complete tub shower transfer with supervision OT Short Term Goal 2 (Week 2): Pt will complete LB dressing with supervision OT Short Term Goal 3 (Week 2): Pt will be able to apply toothpaste using right hand to toothbrush. OT Short Term Goal 4 (Week 2): Pt will be able to retrieve clothing from drawers using RUE.  Skilled Therapeutic Interventions/Progress Updates:    Pt sitting EOB, requesting to take a shower and shave.  Reports his left hand hurts so bad he doesn't know if he will be able to use it at all during session, however throughout session pt noted to use left hand without signs of pain or struggle.  He also reports he likes the HEP provided yesterday and has been practicing with [place]. Pt completed stand pivot EOB to w/c with min assist without weightbearing through RLE (hopping) despite Vcs provided to encourage active use of RLE during transfer.  Pt self propelled to sink with mod I.  Shaved using electric razor with mod I using left hand.  Pt did initiate holding toothpaste tube with right hand while applying  toothpaste.  Pt propelled w/c into bathroom for time management.  Step by step cues and min assist needed to complete shower bench transfer using grab bar.  Pt requesting to complete bathing tasks on his own with shower curtain closed.  Therapist provided close supervision with shower curtain pulled and frequent  verbal check ins to ensure safety.  Pt demonstrated holding wash cloth in RUE and washing LUE at end of bathing.  Pt also reports he was able to successfully bathe buttocks using lateral leans and simulated this after drying off.  Min assist to transfer from shower bench to w/c using grab bar.  Pt donned shirt with setup and pants with CGA for sit<>stand.  Stand pivot to bed with CGA using bed rail. Sit to supine with mod I.  Call bell in reach, bed alarm on.  Therapy Documentation Precautions:  Precautions Precautions: Fall Precaution Comments: fall, slightly impulsive, right sided neglect Restrictions Weight Bearing Restrictions: No    Therapy/Group: Individual Therapy  Ezekiel Slocumb 06/24/2021, 1:50 PM

## 2021-06-24 NOTE — Progress Notes (Signed)
Occupational Therapy Note  Patient Details  Name: Christian Russell MRN: [id number] Date of Birth: 02-14-1971  Today's Date: 06/24/2021 OT Missed Time: 45 Minutes Missed Time Reason: Pain;Other (comment) (pt reports pain in LUE, with heat applied)  Pt received laying in bed with heating pad applied to LUE, pt reports he feels like he over exerted his LUE and needs to rest it. Pt declined ADLs or bed level session. Will f/u as time allows to make up for missed minutes.    Pollyann [facility] 06/24/2021, 3:29 PM

## 2021-06-24 NOTE — Progress Notes (Signed)
Speech Language Pathology Daily Session Note  Patient Details  Name: Christian Russell MRN: [id number] Date of Birth: 02-11-1971  Today's Date: 06/24/2021 SLP Missed Time: 45 Minutes Missed Time Reason: Patient unwilling to participate;Other (Comment) (pt making phone calls regarding disability)  Skilled Therapeutic Interventions: Upon SLP arrival pt stated "I'm not doing none of that" and reported he was on hold discussing disability. Pt apologized and politely requested to defer SLP treatment to spend time making phone calls. Pt missed 45 minutes of skilled ST intervention.    Therapy/Group: Individual Therapy  Tamala Ser 06/24/2021, 1:55 PM

## 2021-06-24 NOTE — Progress Notes (Signed)
Physical Therapy Weekly Progress Note  Patient Details  Name: Christian Russell MRN: [id number] Date of Birth: [birth year]  Beginning of progress report period: June 17, 2021 End of progress report period: June 24, 2021  Today's Date: 06/24/2021 PT Individual Time: 0900-1012 PT Individual Time Calculation (min): 72 min   Patient has met 3 of 4 short term goals. Pt is making appropriate progress towards his LTG. He is able to complete bed mobility with minA, squat<>pivot transfers with min to modA depending on direction, minA for sit<>stand transfers to RW, and is ambulating ~157f with minA and RW. He requires application of AIRCAST to his paretic RLE due to ankle instability with inversion during initial contact during gait. Have introduced a foot-up brace to address drop-foot (ankle 2-/5). Will continue to work on strengthening ankle DF with NMR techniques, safety awareness, RLE NMR, and R inattention. He continues to demonstrate poor pain management techniques that inhibit therapy sessions in both PT and OT - his medical team is aware and adjustments have been made but poor frustration tolerance is consistent.  Patient continues to demonstrate the following deficits muscle weakness, impaired timing and sequencing, unbalanced muscle activation, motor apraxia, and decreased motor planning, decreased attention, decreased awareness, decreased problem solving, and decreased safety awareness, and decreased standing balance, decreased postural control, hemiplegia, and decreased balance strategies and therefore will continue to benefit from skilled PT intervention to increase functional independence with mobility.  Patient progressing toward long term goals..  Continue plan of care.  PT Short Term Goals Week 1:  PT Short Term Goal 1 (Week 1): Pt will complete bed mobility with modA PT Short Term Goal 1 - Progress (Week 1): Met PT Short Term Goal 2 (Week 1): Pt will complete bed<>chair transfers with  modA and LRAD PT Short Term Goal 2 - Progress (Week 1): Met PT Short Term Goal 3 (Week 1): Pt will ambulate 541fwith modA and LRAD PT Short Term Goal 3 - Progress (Week 1): Met PT Short Term Goal 4 (Week 1): Pt will improve pain management techniques to allow participation in therapy PT Short Term Goal 4 - Progress (Week 1): Not met Week 2:  PT Short Term Goal 1 (Week 2): Pt will complete bed mobility with CGA PT Short Term Goal 2 (Week 2): Pt will complete bed<>chair transfers with minA and LRAD PT Short Term Goal 3 (Week 2): Pt will ambulate 15040fith minA and LRAD PT Short Term Goal 4 (Week 2): Pt will initiate stair training  Skilled Therapeutic Interventions/Progress Updates:     Pt seated EOB at start of session - agreeable to PT. He's attempting to don R AIRCAST but unsuccessful - will reach out to OT for guidance. Donned with totalA for time, as well as his tennis shoes. Completed squat<>pivot transfer with min/modA towards R side from EOB to w/c - R inattention limiting performance. Pt self propelled in w/c on level ground using hemi technique from his room, downstairs to 70M rehab gym, at distant supervision level - demo's appropriate speed and propulsion with hemi technique.   Applied e-stim pads to R ankle DF with eversion bias using NMES preset settings for small group muscle atrophy. Used [place] NMES with use of trigger switch to implement FES during functional gait.   He ambulated [id number] RW + 100ft 19f5ft w73fsmall based quad cane with minA for both - used FES to facilitate ankle DF on R during swing with combination of AIRCAST for  ankle stability. Initially demonstrated step-to gait pattern with QC but is able to progress to reciprocal stepping with cues. Gait distance limited by fatigue and L hand pain. C/o 7/10 L hand pain in the palmar area after gait trials. (Of note, he had XR of L wrist/hand yesterday showing only mild OA).  Completed seated there-ex to  allow L hand pain to calm. Seated there-ex included LAQ and isometric knee extensions with 4# ankle weight. With LAQ, focused on powering for full extension and controlled slowed lowering for eccentric control to isolate knee stabilizers.   Pt transported back upstairs to his room in w/c and assisted back to EOB via squat<>pivot transfer with CGA towards his stronger L side. He remained seated EOB at conclusion of session with all needs in reach and bed alarm on.   Therapy Documentation Precautions:  Precautions Precautions: Fall Precaution Comments: fall, slightly impulsive, right sided neglect Restrictions Weight Bearing Restrictions: No General:    Therapy/Group: Individual Therapy  Alger Simons 06/24/2021, 7:34 AM

## 2021-06-24 NOTE — Progress Notes (Signed)
PROGRESS NOTE   Subjective/Complaints: Working with Kendal Hymen OT on self care, shaving. Ambulated well with [place] today. Aircast used today.  No other complaints  ROS: denies insomnia, constipation, + low back pain, +lower extremity spasms, +nausea, +rolling ankle with therapy, +can be easily agitated as per therapy   Objective:   DG Wrist 2 Views Left  Result Date: 06/23/2021 CLINICAL DATA:  Intermittent left wrist pain, chronic, increased, no reported acute injury EXAM: LEFT WRIST - 2 VIEW COMPARISON:  None. FINDINGS: No fracture or dislocation. Mild first carpometacarpal joint osteoarthritis. No suspicious focal osseous lesions. No radiopaque foreign bodies. IMPRESSION: Mild first carpometacarpal joint osteoarthritis. No acute osseous abnormality. Electronically Signed   By: Delbert Phenix M.D.   On: 06/23/2021 13:46   No results for input(s): WBC, HGB, HCT, PLT in the last 72 hours.  No results for input(s): NA, K, CL, CO2, GLUCOSE, BUN, CREATININE, CALCIUM in the last 72 hours.   Intake/Output Summary (Last 24 hours) at 06/24/2021 1240 Last data filed at 06/24/2021 0901 Gross per 24 hour  Intake 594 ml  Output 1935 ml  Net -1341 ml        Physical Exam: Vital Signs Blood pressure 111/82, pulse 72, temperature 97.7 F (36.5 C), resp. rate 18, height 5\' 4"  (1.626 m), weight 68.9 kg, SpO2 97 %. Gen: no distress, normal appearing HEENT: oral mucosa pink and moist, NCAT Cardio: Reg rate Chest: normal effort, normal rate of breathing Abd: soft, non-distended Ext: no edema Psych: can get easily agitated with different staff members, usually very pleasant Skin: intact Neuro: Alert and oriented x3 Musculoskeletal: Right sided strength 2/5 EF and EE, 3/5 HF and KE, 0/5 WE and DF +TTP left wrist   Assessment/Plan: 1. Functional deficits which require 3+ hours per day of interdisciplinary therapy in a comprehensive  inpatient rehab setting. Physiatrist is providing close team supervision and 24 hour management of active medical problems listed below. Physiatrist and rehab team continue to assess barriers to discharge/monitor patient progress toward functional and medical goals  Care Tool:  Bathing              Bathing assist Assist Level: Moderate Assistance - Patient 50 - 74%     Upper Body Dressing/Undressing Upper body dressing        Upper body assist Assist Level: Minimal Assistance - Patient > 75%    Lower Body Dressing/Undressing Lower body dressing            Lower body assist Assist for lower body dressing: Moderate Assistance - Patient 50 - 74%     Toileting Toileting    Toileting assist Assist for toileting: Maximal Assistance - Patient 25 - 49%     Transfers Chair/bed transfer  Transfers assist     Chair/bed transfer assist level: Moderate Assistance - Patient 50 - 74%     Locomotion Ambulation   Ambulation assist      Assist level: Minimal Assistance - Patient > 75% Assistive device: Cane-quad Max distance: 110ft   Walk 10 feet activity   Assist  Walk 10 feet activity did not occur: Safety/medical concerns  Assist level: Minimal Assistance - Patient > 75% Assistive  device: Cane-quad   Walk 50 feet activity   Assist Walk 50 feet with 2 turns activity did not occur: Safety/medical concerns  Assist level: Minimal Assistance - Patient > 75% Assistive device: Cane-quad    Walk 150 feet activity   Assist Walk 150 feet activity did not occur: Safety/medical concerns         Walk 10 feet on uneven surface  activity   Assist Walk 10 feet on uneven surfaces activity did not occur: Safety/medical concerns         Wheelchair     Assist Is the patient using a wheelchair?: Yes Type of Wheelchair: Manual    Wheelchair assist level: Dependent - Patient 0% Max wheelchair distance: 150    Wheelchair 50 feet with 2 turns  activity    Assist        Assist Level: Dependent - Patient 0%   Wheelchair 150 feet activity     Assist      Assist Level: Dependent - Patient 0%   Blood pressure 111/82, pulse 72, temperature 97.7 F (36.5 C), resp. rate 18, height 5\' 4"  (1.626 m), weight 68.9 kg, SpO2 97 %.  Medical Problem List and Plan: 1.  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  Antithrombotics: -DVT/anticoagulation:  Mechanical:  Antiembolism stockings, knee (TED hose) Bilateral lower extremities             -antiplatelet therapy: N/A 3. Pain: Continue Oxycodone 10mg  q6H prn- was upset when decreased. Robaxin-750 milligram every 6 hours as needed muscle spasms. Continue kpad for lower back and neck pain 4. Insomnia: Team to provide emotional support.  Continue Melatonin 3 mg nightly as well as Restoril.               -antipsychotic agents: N/A             -pt has worked 3rd shift for 20 years and finds it difficult to sleep at night                         -continue above meds and observe 5. Neuropsych: This patient is capable of making decisions on his own behalf. 6. Skin/Wound Care: Routine skin checks 7. Fluids/Electrolytes/Nutrition: Routine in and outs with follow-up chemistries 8.  Hypertension. Monitor with increased mobility  D/c norvasc- BP remains excellent 9.  Hyperlipidemia.  TG and LDL elevated. Continue Lipitor 10.  Urine drug screen positive marijuana.  Provide counseling 11. Hyperglycemia: educated to avoid drinks and foods with added sugar.  12. Lower extremity spasms: Continue baclofen 5mg  at night- helped.  13. Therapeutic grounds pass to see friends and dog on grounds for his birthday today 8. Agitation regarding food/episode where he could  not use bedside commode, being asked to use right hand while eating, decrease in pain medication: comforted and concerns discussed with staff 15. Right ankle weakness/rolling: aircast ordered.  16. 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 Pain likely secondary to overuse- continue to encourage use of right upper extremity to improve strength and function  LOS: 8  days A FACE TO FACE EVALUATION WAS PERFORMED  Drema Pry [place] Barrero 06/24/2021, 12:40 PM

## 2021-06-25 NOTE — Progress Notes (Signed)
Occupational Therapy Session Note  Patient Details  Name: Christian Russell MRN: [id number] Date of Birth: 30-Jun-1971  Today's Date: 06/25/2021 OT Individual Time: 1100-1200 OT Individual Time Calculation (min): 60 min    Short Term Goals: Week 2:  OT Short Term Goal 1 (Week 2): Pt will complete tub shower transfer with supervision OT Short Term Goal 2 (Week 2): Pt will complete LB dressing with supervision OT Short Term Goal 3 (Week 2): Pt will be able to apply toothpaste using right hand to toothbrush. OT Short Term Goal 4 (Week 2): Pt will be able to retrieve clothing from drawers using RUE.  Skilled Therapeutic Interventions/Progress Updates:    Pt sitting up EOB, requesting to put shoes on and brush his teeth.  Pt tangential but easily redirected today.  Pt educated on compensatory techniques to donn sock and aircast using hemistrategies.  Pt able to donn socks and shoes with overall mod assist.  Asked pt if he would like to walk to sink and pt declined saying he was afraid to with current hemiwalker because it feels unsteady.  Stand pivot EOB to w/c with min assist.  Pt brushed teeth sitting at sink with increased use of RUE noted and able to apply toothpaste toothbrush with slow and labored movements.  Pt also did reach and turn on faucet using RUE (!!) Pt self propelled w/c to large gym.  Educated pt on AE including washcloth mit, built up handle, and elastic shoe laces.  Pt receptive to all of these and plan to trial in future sessions.  Provided pt with alternate hemiwalker and practiced sit<>stand using and appearing more stable and pt reports increased confidence.  Pt then instructed through RUE fine motor/gross motor control activities and neuro re-ed.  Pt noted with very limited to no active wrist extension during functional reach and pinching activity.  Instructed pt in isolated wrist extension against gravity and pt completed 3 x 10.  Also educated pt on self PROM of RUE shoulder, elbow,  wrist, and hand due to mild flexor hypertonicity noted.  Pt returned to room, stand pivot to EOB with min assist.  Call bell in reach, bed alarm on.  Therapy Documentation Precautions:  Precautions Precautions: Fall Precaution Comments: fall, slightly impulsive, right sided neglect Restrictions Weight Bearing Restrictions: No    Therapy/Group: Individual Therapy  Amie Critchley 06/25/2021, 3:06 PM

## 2021-06-25 NOTE — Progress Notes (Signed)
Speech Language Pathology Daily Session Note  Patient Details  Name: Christian Russell MRN: [id number] Date of Birth: 1970-10-25  Today's Date: 06/25/2021 SLP Individual Time: 1005-1100 SLP Individual Time Calculation (min): 55 min  Short Term Goals: Week 2: SLP Short Term Goal 1 (Week 2): Patient will demonstrate awareness to errors and potential errors during functional tasks and simulated funcitonal tasks, with supA verbal and visual cues. SLP Short Term Goal 2 (Week 2): Patient will recall specific information related to daily therapeutic and medical interventions with supA cues for use of compensatory strategies SLP Short Term Goal 3 (Week 2): Patient will perform mildly complex functional problem solving tasks with sup A verbal cues SLP Short Term Goal 4 (Week 2): Patient will demonstrate selective attention during completion of functional tasks with minA verbal cues to redirect.  Skilled Therapeutic Interventions: Pt received supine in bed and agreeable to skilled ST intervention with focus on cognitive goals. SLP provided min-to-mod A verbal redirection due to tangential speech. SLP facilitated session by providing sup-to-min A verbal cues for error awareness, alternating attention between pillbox and medication label, and problem solving with medication management task. Pt expressed interest in using a pillbox at discharge. Recommend further education on medication organization systems and completing simulated pillbox organization task during upcoming session(s). Patient was left in bed with alarm activated and immediate needs within reach at end of session. Continue per current plan of care.      Pain Pain Assessment Pain Scale: 0-10 Pain Score: 6  Pain Type: Chronic pain Pain Location: Back Pain Orientation: Mid;Lower Pain Descriptors / Indicators: Aching Pain Onset: On-going Patients Stated Pain Goal: 2 Pain Intervention(s): Medication (See eMAR)  Therapy/Group: Individual  Therapy  Tamala Ser 06/25/2021, 10:19 AM

## 2021-06-26 MED ORDER — LIDOCAINE 5 % EX OINT
TOPICAL_OINTMENT | Freq: Four times a day (QID) | CUTANEOUS | Status: DC
  Filled 2021-06-26: qty 35.44

## 2021-06-26 NOTE — Progress Notes (Signed)
PROGRESS NOTE   Subjective/Complaints:  Doesn't like his lunch- upset about this- also thinks voltaren helped his deep itching, but not  thumb pain.  Wants something else.    ROS:  Pt denies SOB, abd pain, CP, N/V/C/D, and vision changes   Objective:   No results found. No results for input(s): WBC, HGB, HCT, PLT in the last 72 hours.  No results for input(s): NA, K, CL, CO2, GLUCOSE, BUN, CREATININE, CALCIUM in the last 72 hours.   Intake/Output Summary (Last 24 hours) at 06/26/2021 1716 Last data filed at 06/26/2021 1500 Gross per 24 hour  Intake 712 ml  Output 540 ml  Net 172 ml        Physical Exam: Vital Signs Blood pressure 103/77, pulse 80, temperature 98.2 F (36.8 C), resp. rate 14, height 5\' 4"  (1.626 m), weight 68.9 kg, SpO2 98 %.    General: awake, alert, appropriate, sititng up eating lunch at bedside; hunched over; NAD HENT: conjugate gaze; oropharynx moist CV: regular rate; no JVD Pulmonary: CTA B/L; no W/R/R- good air movement GI: soft, NT, ND, (+)BS Neurological: alert, but delayed responses  Psych: irritable and upset about lunch, and various complaints Skin: intact Musculoskeletal: Right sided strength 2/5 EF and EE, 3/5 HF and KE, 0/5 WE and DF +TTP left wrist- per pt no change   Assessment/Plan: 1. Functional deficits which require 3+ hours per day of interdisciplinary therapy in a comprehensive inpatient rehab setting. Physiatrist is providing close team supervision and 24 hour management of active medical problems listed below. Physiatrist and rehab team continue to assess barriers to discharge/monitor patient progress toward functional and medical goals  Care Tool:  Bathing              Bathing assist Assist Level: Moderate Assistance - Patient 50 - 74%     Upper Body Dressing/Undressing Upper body dressing        Upper body assist Assist Level: Minimal Assistance -  Patient > 75%    Lower Body Dressing/Undressing Lower body dressing            Lower body assist Assist for lower body dressing: Moderate Assistance - Patient 50 - 74%     Toileting Toileting    Toileting assist Assist for toileting: Maximal Assistance - Patient 25 - 49%     Transfers Chair/bed transfer  Transfers assist     Chair/bed transfer assist level: Moderate Assistance - Patient 50 - 74%     Locomotion Ambulation   Ambulation assist      Assist level: Minimal Assistance - Patient > 75% Assistive device: Cane-quad Max distance: 174ft   Walk 10 feet activity   Assist  Walk 10 feet activity did not occur: Safety/medical concerns  Assist level: Minimal Assistance - Patient > 75% Assistive device: Cane-quad   Walk 50 feet activity   Assist Walk 50 feet with 2 turns activity did not occur: Safety/medical concerns  Assist level: Minimal Assistance - Patient > 75% Assistive device: Cane-quad    Walk 150 feet activity   Assist Walk 150 feet activity did not occur: Safety/medical concerns         Walk 10 feet  on uneven surface  activity   Assist Walk 10 feet on uneven surfaces activity did not occur: Safety/medical concerns         Wheelchair     Assist Is the patient using a wheelchair?: Yes Type of Wheelchair: Manual    Wheelchair assist level: Dependent - Patient 0% Max wheelchair distance: 150    Wheelchair 50 feet with 2 turns activity    Assist        Assist Level: Dependent - Patient 0%   Wheelchair 150 feet activity     Assist      Assist Level: Dependent - Patient 0%   Blood pressure 103/77, pulse 80, temperature 98.2 F (36.8 C), resp. rate 14, height 5\' 4"  (1.626 m), weight 68.9 kg, SpO2 98 %.  Medical Problem List and Plan: 1.  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  Antithrombotics: -DVT/anticoagulation:  Mechanical:  Antiembolism stockings, knee (TED hose) Bilateral lower extremities             -antiplatelet therapy: N/A 3. Pain: Continue Oxycodone 10mg  q6H prn- was upset when decreased. Robaxin-750 milligram every 6 hours as needed muscle spasms. Continue kpad for lower back and neck pain  10/30- will add lidocaine cream for L wrist/hand QID 4. Insomnia: Team to provide emotional support.  Continue Melatonin 3 mg nightly as well as Restoril.               -antipsychotic agents: N/A             -pt has worked 3rd shift for 20 years and finds it difficult to sleep at night                         -continue above meds and observe 5. Neuropsych: This patient is capable of making decisions on his own behalf. 6. Skin/Wound Care: Routine skin checks 7. Fluids/Electrolytes/Nutrition: Routine in and outs with follow-up chemistries 8.  Hypertension. Monitor with increased mobility  D/c norvasc- BP remains excellent  10/30- BP actually al ittle soft- con't regimen 9.  Hyperlipidemia.  TG and LDL elevated. Continue Lipitor 10.  Urine drug screen positive marijuana.  Provide counseling 11. Hyperglycemia: educated to avoid drinks and foods with added sugar.  12. Lower extremity spasms: Continue baclofen 5mg  at night- helped.  13. Therapeutic grounds pass to see friends and dog on grounds for his birthday today 52. Agitation regarding food/episode where he could not use bedside commode, being asked to use right hand while eating, decrease in pain medication: comforted and concerns discussed with staff  10/30- encouraged pt to speaking politely with staff about his food 15. Right ankle weakness/rolling: aircast ordered.  16. 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 Pain likely secondary to overuse- continue to  encourage use of right upper extremity to improve strength and function  10/30- will try lidocaine cream QID  LOS: 10 days A FACE TO FACE EVALUATION WAS PERFORMED  Christian Russell 06/26/2021, 5:16 PM

## 2021-06-27 NOTE — Progress Notes (Signed)
Physical Therapy Session Note  Patient Details  Name: Christian Russell MRN: [id number] Date of Birth: 1971/04/26  Today's Date: 06/27/2021 PT Individual Time: 4098-1191 + 1400-1435 PT Individual Time Calculation (min): 57 min + 35 min  Short Term Goals: Week 2:  PT Short Term Goal 1 (Week 2): Pt will complete bed mobility with CGA PT Short Term Goal 2 (Week 2): Pt will complete bed<>chair transfers with minA and LRAD PT Short Term Goal 3 (Week 2): Pt will ambulate 138f with minA and LRAD PT Short Term Goal 4 (Week 2): Pt will initiate stair training  Skilled Therapeutic Interventions/Progress Updates:      1st session: Pt sitting EOB at start of session. RN at bedside providing medications. He was able to don his L tennis shoes with setupA but required totalA for donning R tennis shoe and aircast to RLE. Completed squat<>pivot transfer with minA towards weaker R side from EOB to w/c. Pt requesting to complete some self care tasks at the sink prior to leaving his room. He brushed his teeth without assist while seated in w/c. He required assist for applying deodorant to L underarm. Pt also continent of bladder while using urinal - charted in flowsheets.   Pt then propelled himself in w/c using hemi technique from his room downstairs (via elevator) to 79M rehab gym - completed at supervision level.   Focused remainder of session on functional gait training. He ambulated 3x829fwith very light minA and wide based QC on level ground - provided shoe cover during 2nd gait trial to reduce R toe drag during swing. No evidence of true knee buckling or overt LOB - requires minA at times for steadying during turns, especially once fatigued towards end of 8528fHe does c/o moderate L wrist/hand pain, related to his chronic OA. Rest breaks as needed.   Then worked on R ankle DF strengthening with 1lb ankle weight attached to forefoot - completed 2x10 reps of ankle DF with assist needed for keeping R ankle in  neutral alignment due to inversion. Able to complete partial ROM with ankle weight.   Pt transported back upstairs to his room in w/c for time. Completed squat<>pivot transfer with CGA towards his stronger L side to EOB. Requesting to remained seated EOB at end of session. Bed alarm on and all needs in reach. Continue PT POC.   2nd session: Pt seated EOB at start of session. Agreeable to PT tx - avoiding asking pain to avoid pain focused behaviors. Pt reports he's in a hurry to do therapy because his mom is visiting and would like to spend time with her. Donned tennis shoes and aircast to RLE with totalA for time. Squat<>pivot with minA to w/c and then pt propelled himself with supervision via hemi technique in [address], >200f77focused remainder of session on functional gait training. He ambulated ~125ft93f125ft 35f minA and wide based QC - cues needed for increasing R knee flexion in swing, monitoring R foot placement and avoiding inversion at the ankle, and general cane sequencing. No knee buckling or LOB observed, but increased unsteadiness noted while turning to sit. Pt pleasantly requesting to end session early as his mom is waiting in the room. Pt transported back to room for time and mom at bedside. Pt requesting to go off the unit with mom - cleared with RN as pt has grounds pass. All needs met at end of session.  Pt missed 10 minutes of skilled therapy.  Therapy Documentation  Precautions:  Precautions Precautions: Fall Precaution Comments: fall, slightly impulsive, right sided neglect Restrictions Weight Bearing Restrictions: No General:    Therapy/Group: Individual Therapy  Alger Simons 06/27/2021, 7:43 AM

## 2021-06-27 NOTE — Progress Notes (Signed)
PROGRESS NOTE   Subjective/Complaints: At first requests that 30 minute 11:30 to 12pm session be canceled so that he has time to eat. Discussed that he will have between 12 and 2 to eat and he is agreeable.    ROS:  Pt denies SOB, abd pain, CP, N/V/C/D, and vision changes, +left wrist pain   Objective:   No results found. No results for input(s): WBC, HGB, HCT, PLT in the last 72 hours.  No results for input(s): NA, K, CL, CO2, GLUCOSE, BUN, CREATININE, CALCIUM in the last 72 hours.   Intake/Output Summary (Last 24 hours) at 06/27/2021 1000 Last data filed at 06/27/2021 0934 Gross per 24 hour  Intake 708 ml  Output 1175 ml  Net -467 ml        Physical Exam: Vital Signs Blood pressure 123/84, pulse 64, temperature 97.7 F (36.5 C), resp. rate 18, height 5\' 4"  (1.626 m), weight 68.9 kg, SpO2 100 %.  Gen: no distress, normal appearing HEENT: oral mucosa pink and moist, NCAT Cardio: Reg rate Chest: normal effort, normal rate of breathing Abd: soft, non-distended Ext: no edema Psych: pleasant, normal affect Skin: intact Neurological: alert, but delayed responses  Psych: irritable and upset about lunch, and various complaints Skin: intact Musculoskeletal: Right sided strength 2/5 EF and EE, 3/5 HF and KE, 0/5 WE and DF +TTP left wrist- per pt no change   Assessment/Plan: 1. Functional deficits which require 3+ hours per day of interdisciplinary therapy in a comprehensive inpatient rehab setting. Physiatrist is providing close team supervision and 24 hour management of active medical problems listed below. Physiatrist and rehab team continue to assess barriers to discharge/monitor patient progress toward functional and medical goals  Care Tool:  Bathing              Bathing assist Assist Level: Moderate Assistance - Patient 50 - 74%     Upper Body Dressing/Undressing Upper body dressing         Upper body assist Assist Level: Minimal Assistance - Patient > 75%    Lower Body Dressing/Undressing Lower body dressing            Lower body assist Assist for lower body dressing: Moderate Assistance - Patient 50 - 74%     Toileting Toileting    Toileting assist Assist for toileting: Maximal Assistance - Patient 25 - 49%     Transfers Chair/bed transfer  Transfers assist     Chair/bed transfer assist level: Moderate Assistance - Patient 50 - 74%     Locomotion Ambulation   Ambulation assist      Assist level: Minimal Assistance - Patient > 75% Assistive device: Cane-quad Max distance: 190ft   Walk 10 feet activity   Assist  Walk 10 feet activity did not occur: Safety/medical concerns  Assist level: Minimal Assistance - Patient > 75% Assistive device: Cane-quad   Walk 50 feet activity   Assist Walk 50 feet with 2 turns activity did not occur: Safety/medical concerns  Assist level: Minimal Assistance - Patient > 75% Assistive device: Cane-quad    Walk 150 feet activity   Assist Walk 150 feet activity did not occur: Safety/medical concerns  Walk 10 feet on uneven surface  activity   Assist Walk 10 feet on uneven surfaces activity did not occur: Safety/medical concerns         Wheelchair     Assist Is the patient using a wheelchair?: Yes Type of Wheelchair: Manual    Wheelchair assist level: Dependent - Patient 0% Max wheelchair distance: 150    Wheelchair 50 feet with 2 turns activity    Assist        Assist Level: Dependent - Patient 0%   Wheelchair 150 feet activity     Assist      Assist Level: Dependent - Patient 0%   Blood pressure 123/84, pulse 64, temperature 97.7 F (36.5 C), resp. rate 18, height 5\' 4"  (1.626 m), weight 68.9 kg, SpO2 100 %.  Medical Problem List and Plan: 1.  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  Antithrombotics: -DVT/anticoagulation:  Mechanical:  Antiembolism stockings, knee (TED hose) Bilateral lower extremities             -antiplatelet therapy: N/A 3. Low back pain: Continue Oxycodone 10mg  q6H prn- was upset when decreased. Robaxin-750 milligram every 6 hours as needed muscle spasms. Continue kpad for lower back and neck pain 4. Insomnia: Team to provide emotional support.  Continue Melatonin 3 mg nightly as well as Restoril.               -antipsychotic agents: N/A             -pt has worked 3rd shift for 20 years and finds it difficult to sleep at night                         -continue above meds and observe 5. Neuropsych: This patient is capable of making decisions on his own behalf. 6. Skin/Wound Care: Routine skin checks 7. Fluids/Electrolytes/Nutrition: Routine in and outs with follow-up chemistries 8.  Hypertension. Monitor with increased mobility  D/c norvasc- BP remains excellent  10/30- BP actually al ittle soft- con't regimen 9.  Hyperlipidemia.  TG and LDL elevated. Continue Lipitor 10.  Urine drug screen positive marijuana.  Provide counseling 11. Hyperglycemia: educated to avoid drinks and foods with added sugar.  12. Lower extremity spasms: No longer bothersome, d/c balcofen 13. Therapeutic grounds pass to see friends and dog on grounds for his birthday today 68. Agitation regarding food/episode where he could not use bedside commode, being asked to use right hand while eating, decrease in pain medication: comforted and concerns discussed with staff  10/30- encouraged pt to speaking politely with staff about his food 15. Right ankle weakness/rolling: aircast ordered.  16. 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 Pain likely secondary to overuse- continue to  encourage use of right upper extremity to improve strength and function. Lidocaine cream and wrist splint added.   LOS: 11 days A FACE TO FACE EVALUATION WAS PERFORMED  Christian Russell P Christian Russell 06/27/2021, 10:00 AM

## 2021-06-27 NOTE — Progress Notes (Signed)
Occupational Therapy Session Note  Patient Details  Name: Christian Russell MRN: [id number] Date of Birth: February 06, 1971  Today's Date: 06/27/2021 OT Individual Time: 1000-1055 OT Individual Time Calculation (min): 55 min    Short Term Goals: Week 2:  OT Short Term Goal 1 (Week 2): Pt will complete tub shower transfer with supervision OT Short Term Goal 2 (Week 2): Pt will complete LB dressing with supervision OT Short Term Goal 3 (Week 2): Pt will be able to apply toothpaste using right hand to toothbrush. OT Short Term Goal 4 (Week 2): Pt will be able to retrieve clothing from drawers using RUE.  Skilled Therapeutic Interventions/Progress Updates:    Pt sitting EOB, c/o pain in LUE due to working it a lot in PT session.  Receptive to neuro re-ed focus for RUE.  Stand pivot with close supervision to w/c.  Propelled pt to therapy gym to rest LUE and reduce pain.  Pt completed seated fine/gross motor coordination tasks at tabletop in order to facilitate lat and 3 jaw chuck pinching and forward reaching combined movements.  Pt utilized min and medium resistive clothespins to pinch and place on various sized dowels at different heights.  Pt required max multimodal cues to encourage functional positioning and educated pt on observing non affected arm during same task to relearn sequencing of movement and body mechanics.  Pt increased precision and reduced dropping frequency of clothespins after blocked practice.  Pt then instructed on table slides forward/back, towel gathers isolating index - small, and mcp extension finger taps.  Noted pt still placing wrist in flexion during fine motor tasks and instructed pt on activity to promote wrist extension while gripping putty.  Pt returned to room, stand pivot to EOB with close supervision. Call bell in reach, bed alarm on.  Therapy Documentation Precautions:  Precautions Precautions: Fall Precaution Comments: fall, slightly impulsive, right sided  neglect Restrictions Weight Bearing Restrictions: No    Therapy/Group: Individual Therapy  Amie Critchley 06/27/2021, 3:21 PM

## 2021-06-27 NOTE — Progress Notes (Signed)
Occupational Therapy Note  Patient Details  Name: Christian Russell MRN: [id number] Date of Birth: [birth year]  Today's Date: 06/27/2021 OT Missed Time: 30 Minutes Missed Time Reason: Patient unwilling/refused to participate without medical reason;Pain;Other (comment) (Pt refused, said he was waiting on pain meds and would not to 30 min session.)  Pt received in bed and immediately refused 30 minute skilled OT session, stating that his pain meds were "an hour and a half late and I'm not doing therapy".   30 minutes of skilled OT missed d/t patient refusal.   Lucie Friedlander 06/27/2021, 11:58 AM

## 2021-06-27 NOTE — Progress Notes (Signed)
Orthopedic Tech Progress Note Patient Details:  Christian Russell 03/06/1971 [id number]  Dropped off VELCRO WRIST SPLINT this morning while patient was working with THERAPY   Ortho Devices Type of Ortho Device: Velcro wrist splint Ortho Device/Splint Location: LUE Ortho Device/Splint Interventions: Ordered   Post Interventions Patient Tolerated: Well Instructions Provided: Care of device  [name] 06/27/2021, 12:02 PM

## 2021-06-27 NOTE — Progress Notes (Signed)
Inpatient Rehabilitation Discharge Medication Review by a Pharmacist  A complete drug regimen review was completed for this patient to identify any potential clinically significant medication issues.  High Risk Drug Classes Is patient taking? Indication by Medication  Antipsychotic No   Anticoagulant No   Antibiotic No   Opioid Yes Oxycodone for pain control  Antiplatelet No   Hypoglycemics/insulin No   Vasoactive Medication No   Chemotherapy No   Other Yes Melatonin, temazepam for sleep, baclofen for sleep/muscle spasms     Type of Medication Issue Identified Description of Issue Recommendation(s)  Drug Interaction(s) (clinically significant)     Duplicate Therapy     Allergy     No Medication Administration End Date     Incorrect Dose     Additional Drug Therapy Needed     Significant med changes from prior encounter (inform family/care partners about these prior to discharge).    Other       Clinically significant medication issues were identified that warrant physician communication and completion of prescribed/recommended actions by midnight of the next day:  No   Pharmacist comments: No issues identified  Time spent performing this drug regimen review (minutes):  20 min   Elwin Sleight 06/27/2021 9:11 AM

## 2021-06-28 NOTE — Progress Notes (Signed)
PROGRESS NOTE   Subjective/Complaints: Feeling well this morning Went to the bathroom (bed-side) by himself and was able to clean himself which he is very happy about   ROS:  Pt denies SOB, abd pain, CP, N/V/C/D, and vision changes, +left wrist pain   Objective:   No results found. No results for input(s): WBC, HGB, HCT, PLT in the last 72 hours.  No results for input(s): NA, K, CL, CO2, GLUCOSE, BUN, CREATININE, CALCIUM in the last 72 hours.   Intake/Output Summary (Last 24 hours) at 06/28/2021 1237 Last data filed at 06/28/2021 0743 Gross per 24 hour  Intake 768 ml  Output 1125 ml  Net -357 ml        Physical Exam: Vital Signs Blood pressure 111/65, pulse 70, temperature 97.7 F (36.5 C), resp. rate 17, height 5\' 4"  (1.626 m), weight 68.9 kg, SpO2 93 %.  Gen: no distress, normal appearing HEENT: oral mucosa pink and moist, NCAT Cardio: Reg rate Chest: normal effort, normal rate of breathing Abd: soft, non-distended Ext: no edema Psych: pleasant, normal affect Skin: intact Neurological: alert, but delayed responses  Psych: irritable and upset about lunch, and various complaints Skin: intact Musculoskeletal: Right sided strength 2/5 EF and EE, 3/5 HF and KE, 0/5 WE and DF +TTP left wrist- per pt no change   Assessment/Plan: 1. Functional deficits which require 3+ hours per day of interdisciplinary therapy in a comprehensive inpatient rehab setting. Physiatrist is providing close team supervision and 24 hour management of active medical problems listed below. Physiatrist and rehab team continue to assess barriers to discharge/monitor patient progress toward functional and medical goals  Care Tool:  Bathing              Bathing assist Assist Level: Moderate Assistance - Patient 50 - 74%     Upper Body Dressing/Undressing Upper body dressing        Upper body assist Assist Level: Minimal  Assistance - Patient > 75%    Lower Body Dressing/Undressing Lower body dressing            Lower body assist Assist for lower body dressing: Moderate Assistance - Patient 50 - 74%     Toileting Toileting    Toileting assist Assist for toileting: Maximal Assistance - Patient 25 - 49%     Transfers Chair/bed transfer  Transfers assist     Chair/bed transfer assist level: Moderate Assistance - Patient 50 - 74%     Locomotion Ambulation   Ambulation assist      Assist level: Minimal Assistance - Patient > 75% Assistive device: Cane-quad Max distance: 15ft   Walk 10 feet activity   Assist  Walk 10 feet activity did not occur: Safety/medical concerns  Assist level: Minimal Assistance - Patient > 75% Assistive device: Cane-quad   Walk 50 feet activity   Assist Walk 50 feet with 2 turns activity did not occur: Safety/medical concerns  Assist level: Minimal Assistance - Patient > 75% Assistive device: Cane-quad    Walk 150 feet activity   Assist Walk 150 feet activity did not occur: Safety/medical concerns         Walk 10 feet on uneven surface  activity   Assist Walk 10 feet on uneven surfaces activity did not occur: Safety/medical concerns         Wheelchair     Assist Is the patient using a wheelchair?: Yes Type of Wheelchair: Manual    Wheelchair assist level: Dependent - Patient 0% Max wheelchair distance: 150    Wheelchair 50 feet with 2 turns activity    Assist        Assist Level: Dependent - Patient 0%   Wheelchair 150 feet activity     Assist      Assist Level: Dependent - Patient 0%   Blood pressure 111/65, pulse 70, temperature 97.7 F (36.5 C), resp. rate 17, height 5\' 4"  (1.626 m), weight 68.9 kg, SpO2 93 %.  Medical Problem List and Plan: 1.  Right side weakness and dysarthria secondary to left basal ganglia hemorrhage secondary to hypertensive crisis             -patient may shower              -ELOS/Goals: 10-14 days, mod I to supervision goals with PT, OT, SLP             -order PRAFO and WHO   Continue CIR- PT, OT and SLP 2.  Antithrombotics: -DVT/anticoagulation:  Mechanical:  Antiembolism stockings, knee (TED hose) Bilateral lower extremities             -antiplatelet therapy: N/A 3. Low back pain: Continue Oxycodone 10mg  q6H prn- was upset when decreased. Robaxin-750 milligram every 6 hours as needed muscle spasms. Continue kpad for lower back and neck pain 4. Insomnia: Team to provide emotional support.  Continue Melatonin 3 mg nightly as well as Restoril.               -antipsychotic agents: N/A             -pt has worked 3rd shift for 20 years and finds it difficult to sleep at night                         -continue above meds and observe 5. Neuropsych: This patient is capable of making decisions on his own behalf. 6. Skin/Wound Care: Routine skin checks 7. Fluids/Electrolytes/Nutrition: Routine in and outs with follow-up chemistries 8.  Hypertension. Monitor with increased mobility  D/c norvasc- BP remains excellent  10/30- BP actually al ittle soft- con't regimen 9.  Hyperlipidemia.  TG and LDL elevated. Continue Lipitor 10.  Urine drug screen positive marijuana.  Provide counseling 11. Hyperglycemia: educated to avoid drinks and foods with added sugar.  12. Lower extremity spasms: No longer bothersome, d/c balcofen 13. Therapeutic grounds pass to see friends and dog on grounds for his birthday today 64. Agitation regarding food/episode where he could not use bedside commode, being asked to use right hand while eating, decrease in pain medication: comforted and concerns discussed with staff. Much better mood today 15. Right ankle weakness/rolling: aircast ordered.  16. 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 Pain likely secondary to overuse- continue to encourage use of right upper extremity to improve strength and function. Lidocaine cream and wrist  splint added- he is wearing during the day 17. Overweight BMI 26.07: provide dietary education 18. Disposition: Messaged team to see if patient can leave Friday  LOS: 12 days A FACE TO FACE EVALUATION WAS PERFORMED  18 Christian Russell 06/28/2021, 12:37 PM

## 2021-06-28 NOTE — Progress Notes (Signed)
Speech Language Pathology Daily Session Note  Patient Details  Name: Christian Russell MRN: [id number] Date of Birth: 03-Mar-1971  Today's Date: 06/28/2021 SLP Missed Time: 45 Minutes Missed Time Reason: Other (Comment) (scheduling conflict)  Skilled Therapeutic Interventions: Pt missed 45 minutes of skilled ST intervention due to scheduling conflict/administrative duties. Will attempt to make-up session as time/schedule allows.   Tamala Ser 06/28/2021, 11:34 AM

## 2021-06-28 NOTE — Progress Notes (Signed)
Physical Therapy Session Note  Patient Details  Name: Christian Russell MRN: [id number] Date of Birth: 1971-03-04  Today's Date: 06/28/2021 PT Individual Time: 1345-1440 PT Individual Time Calculation (min): 55 min   Short Term Goals: Week 2:  PT Short Term Goal 1 (Week 2): Pt will complete bed mobility with CGA PT Short Term Goal 2 (Week 2): Pt will complete bed<>chair transfers with minA and LRAD PT Short Term Goal 3 (Week 2): Pt will ambulate 174ft with minA and LRAD PT Short Term Goal 4 (Week 2): Pt will initiate stair training  Skilled Therapeutic Interventions/Progress Updates:     Pt seen sitting in w/c - agreeable to PT treatment. No reports of pain. Notified by rehab team that pt is requesting to return home earlier, by Friday 11/4. Lengthy discussions during session regarding DC planning, home safety, DME rec's, fall prevention, how to don/doff AIRCAST, etc. Pt engaged in conversation throughout DC planning. Plan for his mom to come for family education either Thursday 11/3 or Friday 11/4 depending on scheduling - will reach out to confirm.   Pt propelled himself mod I using hemi technique from his room downstairs to 15M rehab gym. Focused majority of session on stair training as pt has 3 STE home with 2 HR's. He navigated up/down 8 + 12 6inch steps (seated rest break) with minA and 2 hand rails. Mod cues needed (fading to min cues) for stepping sequencing with ascending with L and descending with R foot leading with a step-to pattern. Cues needed as well for R foot placement during descent to avoid overcrowding. Noted hip-hiking on R during ascent for compensation of hip flexor and hamstring weakness.   Next, worked on functional gait training in hallways on level ground. He ambulated 2x189ft with CGA (intermittent minA for balance) and wide based QC. Demonstrates step-through gait pattern with decreased weight shift R. Some difficulty with R foot placement but this improves with repetition.  Demonstrates appropriate cane sequencing during gait - c/o L wrist/hand pain after gait, related to his OA. Rest breaks provided as needed.   Pt transported back upstairs to his room for time. Remained seated in w/c with all needs in reach. Communicated with rehab team via secure chat of DC planning.   Therapy Documentation Precautions:  Precautions Precautions: Fall Precaution Comments: fall, slightly impulsive, right sided neglect Restrictions Weight Bearing Restrictions: No General:    Therapy/Group: Individual Therapy  Orrin Brigham 06/28/2021, 7:41 AM

## 2021-06-28 NOTE — Progress Notes (Signed)
Physical Therapy Session Note  Patient Details  Name: Christian Russell MRN: [id number] Date of Birth: 19-Sep-1970  Today's Date: 06/28/2021 PT Individual Time: 2725-3664 PT Individual Time Calculation (min): 44 min   Short Term Goals: Week 2:  PT Short Term Goal 1 (Week 2): Pt will complete bed mobility with CGA PT Short Term Goal 2 (Week 2): Pt will complete bed<>chair transfers with minA and LRAD PT Short Term Goal 3 (Week 2): Pt will ambulate 155ft with minA and LRAD PT Short Term Goal 4 (Week 2): Pt will initiate stair training  Skilled Therapeutic Interventions/Progress Updates:    Patient received sitting up edge of bed, agreeable to PT, but upset that he didn't have OT scheduled first. Patient transferring to wc via stand pivot with supervision and compensatory strategies. Patient completing morning ADLs at sink with set up A including shaving and brushing teeth. Patient dressing while seated with set up assist and CGA for lower body dressing with compensatory strategies. PT adding elastic shoe laces to R shoe to aid in patients independence with donning shoes. Patient remaining up in wc, no seatbelt alarm on because he was on the phone with his mom and requested that PT leave- NT aware. Call light within reach.    Therapy Documentation Precautions:  Precautions Precautions: Fall Precaution Comments: fall, slightly impulsive, right sided neglect Restrictions Weight Bearing Restrictions: No   Therapy/Group: Individual Therapy  Elizebeth Koller, PT, DPT, CBIS  06/28/2021, 7:40 AM

## 2021-06-28 NOTE — Progress Notes (Signed)
Occupational Therapy Session Note  Patient Details  Name: Christian Russell MRN: [id number] Date of Birth: 1971/07/07  Today's Date: 06/28/2021 OT Individual Time: 1300-1345 OT Individual Time Calculation (min): 45 min    Short Term Goals: Week 1:  OT Short Term Goal 1 (Week 1): Pt will complete sit <>stand transfer with LRAD needing min assist in prep for self care. OT Short Term Goal 1 - Progress (Week 1): Met OT Short Term Goal 2 (Week 1): Pt will donn/doff pants with CGA. OT Short Term Goal 2 - Progress (Week 1): Met OT Short Term Goal 3 (Week 1): Pt will complete tub shower transfer with min assist. OT Short Term Goal 3 - Progress (Week 1): Met OT Short Term Goal 4 (Week 1): Pt will increase RUE MMT to 3+/5 to facilitate improved independence with functional transfers. OT Short Term Goal 4 - Progress (Week 1): Revised due to lack of progress  Skilled Therapeutic Interventions/Progress Updates:    Pt sitting up at EOB, requesting to use urinal and c/o pain in left thumb.  Pt urinated with setup sitting EOB.  Pt then donned shoes with setup as well.  Stand pivot EOB to w/c with supervision.  Discussed bathroom layout for dc planning.  Transported to ADL tubshower and educated pt on safe tub bench transfer.  Pt able to complete with supervision primarily to increase right sided attention due to leaving RUE hanging at side and sometimes sitting on arm.  Pt returned to room and simulated RUE use to brush teeth with use of built up handle.  Call bell in reach at end of session.  Therapy Documentation Precautions:  Precautions Precautions: Fall Precaution Comments: fall, slightly impulsive, right sided neglect Restrictions Weight Bearing Restrictions: No    Therapy/Group: Individual Therapy  Ezekiel Slocumb 06/28/2021, 3:20 PM

## 2021-06-28 NOTE — Progress Notes (Signed)
Patient ID: Christian Russell, male   DOB: [birth year], 50 y.o.   MRN: [id number] Pt is requesting to discharge Friday 11/4 instead of 11/11. He feels he can manage along with Mom's help. MD is on board along with team. Due to his insurance will need to go to OP due to finding home health to take his insurance is difficult. Discuss in team conference tomorrow.

## 2021-06-29 NOTE — Progress Notes (Signed)
Speech Language Pathology Daily Session Note  Patient Details  Name: Christian Russell MRN: [id number] Date of Birth: 07-Sep-1970  Today's Date: 06/29/2021 SLP Individual Time: 1345-1430 SLP Individual Time Calculation (min): 45 min  Short Term Goals: Week 2: SLP Short Term Goal 1 (Week 2): Patient will demonstrate awareness to errors and potential errors during functional tasks and simulated funcitonal tasks, with supA verbal and visual cues. SLP Short Term Goal 2 (Week 2): Patient will recall specific information related to daily therapeutic and medical interventions with supA cues for use of compensatory strategies SLP Short Term Goal 3 (Week 2): Patient will perform mildly complex functional problem solving tasks with sup A verbal cues SLP Short Term Goal 4 (Week 2): Patient will demonstrate selective attention during completion of functional tasks with minA verbal cues to redirect.  Skilled Therapeutic Interventions: Pt received sitting EOB and agreeable to skilled ST intervention with focus on cognitive goals. SLP facilitated session by providing sup A for attention to task with improved attentiveness to task and reduced tangential speech. Pt was mod I for use of compensatory memory strategies to recall functional information related to therapy schedule, and mod I for error awareness with medication administration errors. He was mod I-to-sup A for anticipatory problem solving with medication management and home safety scenarios. Pt displayed verbal agitation at end of session after bed alarm was activated as he attempted to adjust his shoes from EOB. SLP provided verbal explanation that bed alarm is required for safety purposes and re-set bed alarm per policy. Patient was left in bed with alarm activated and immediate needs within reach at end of session. Continue per current plan of care.      Pain Pain Assessment Pain Scale: 0-10 Pain Score: 0-No pain  Therapy/Group: Individual  Therapy  Tamala Ser 06/29/2021, 2:29 PM

## 2021-06-29 NOTE — Progress Notes (Addendum)
Patient ID: Christian Russell, male   DOB: [birth year], 50 y.o.   MRN: [id number]  Spoke with Mom via telephone to discuss pt wanting to discharge this Friday. She is aware and is on board with this. She is getting the house ready for him. Discussed needing to come in for education and she can come in Friday at 10:00 am then after can go over DC instructions and go home. Will work toward discharge Friday.  12:30 PM Met with pt to confirm discharge Friday after team conference. Pt plans to get [facility] on own and have this worker order wheelchair for him. Referral made to [employer] and will deliver to room prior to discharge home. Mom coming in Friday at 10:00 for education prior to discharge home. Referral made to [facility] for follow up. Will contact pt and or Mom to set up appointments. Pt pleased with this plan and feels ready to go home Friday.

## 2021-06-29 NOTE — Progress Notes (Signed)
Inpatient Rehabilitation Discharge Medication Review by a Pharmacist  A complete drug regimen review was completed for this patient to identify any potential clinically significant medication issues.  High Risk Drug Classes Is patient taking? Indication by Medication  Antipsychotic No   Anticoagulant No   Antibiotic No   Opioid Yes Oxycodone for pain control  Antiplatelet No   Hypoglycemics/insulin No   Vasoactive Medication No   Chemotherapy No   Other Yes Melatonin, temazepam for sleep, baclofen for sleep/muscle spasms     Type of Medication Issue Identified Description of Issue Recommendation(s)  Drug Interaction(s) (clinically significant)     Duplicate Therapy     Allergy     No Medication Administration End Date     Incorrect Dose     Additional Drug Therapy Needed     Significant med changes from prior encounter (inform family/care partners about these prior to discharge).    Other       Clinically significant medication issues were identified that warrant physician communication and completion of prescribed/recommended actions by midnight of the next day:  No   Pharmacist comments: No issues identified  Time spent performing this drug regimen review (minutes):  20 min   Elwin Sleight 06/29/2021 10:32 AM

## 2021-06-29 NOTE — Patient Care Conference (Signed)
Inpatient RehabilitationTeam Conference and Plan of Care Update Date: 06/29/2021   Time: 11:00 AM    Patient Name: Christian Russell      Medical Record Number: [id number]  Date of Birth: Sep 27, 1970 Sex: Male         Room/Bed: 5C09C/5C09C-01 Payor Info: Payor: [employer]UE [employer] BLUE [employer]IELD / Plan: BCBS COMM PPO / Product Type: *No Product type* /    Admit Date/Time:  06/16/2021  3:52 PM  Primary Diagnosis:  ICH (intracerebral hemorrhage) [facility])  Hospital Problems: Principal Problem:   ICH (intracerebral hemorrhage) [facility])    Expected Discharge Date: Expected Discharge Date: 07/01/21  Team Members Present: Physician leading conference: Dr. Maryla Morrow Social Worker Present: Dossie Der, LCSW Nurse Present: Chana Bode, RN PT Present: Wynelle Link, PT OT Present: Dolphus Jenny, OT SLP Present: Gerda Diss, SLP PPS Coordinator present : Fae Pippin, SLP     Current Status/Progress Goal Weekly Team Focus  Bowel/Bladder     Continent of bowel and bladder   Continent  Assist with toileting  Swallow/Nutrition/ Hydration             ADL's   CGA-supervision functional transfers and functional transfers  mod I-sup  neuro re-ed RUE/RLE, functional transfer training, self care training/hemi strategies, safety awareness   Mobility   CGA for bed mobility, minA squat<>pivot transfers to weaker R side, CGA for transfers to his stronger L side. minA for sit<>stand transfers to QC. Ambulating ~151ft with minA and QC. Has been doing well with AIRCAST to R foot with improve ankle stability.  minA - upgrade to supervision  functional transfers, RLE NMR, gait training with QC, ankle strengthening for DF and eversion, safety awareness, beginning stair training   Communication   mod I  mod I  conversational speech intelligibliity 100%   Safety/Cognition/ Behavioral Observations  sup A  mod I basic to mild complex cognition  functional problem solving, selective attention, anticipatory awareness    Pain     Pain all over   Pain managed with prn meds   Monitor need for and effectiveness of meds  Skin     N/A          Discharge Planning:  Mom on board with DC Friday 11/4, coming in for education at 10:00 then will DC home   Team Discussion: Medically stable per MD. Pain managed with current regimen.  Patient on target to meet rehab goals: yes, currently on track to meet goals. Needs CGA - min assist for transfers. Able to ambulate up to 150' using a quad cane and CG - min assist. Occasional loss of balance with long distance gait.   *See Care Plan and progress notes for long and short-term goals.   Revisions to Treatment Plan:  Stair training Aircast to ankle Practice tub transfers to shower chair   Teaching Needs: Safety, medications, secondary risk management, transfers, etc  Current Barriers to Discharge: Decreased caregiver support and Home enviroment access/layout  Possible Resolutions to Barriers: Family education with mother DME; W/C, with arm trough and quad cane     Medical Summary Current Status: overweight, low back pain, muscle spasms in legs, left wrist pain    Barriers to Discharge Comments: overweight, low back pain, muscle spasms in legs, left wrist pain Possible Resolutions to [employer] Focus: provide dietary education, discussed oxycodone taper at home, continue wrist splint and lidocaine jelly, d/c baclofen   Continued Need for Acute Rehabilitation Level of Care: The patient requires daily medical management by a  physician with specialized training in physical medicine and rehabilitation for the following reasons: Direction of a multidisciplinary physical rehabilitation program to maximize functional independence : Yes Medical management of patient stability for increased activity during participation in an intensive rehabilitation regime.: Yes Analysis of laboratory values and/or radiology reports with any subsequent need for medication  adjustment and/or medical intervention. : Yes   I attest that I was present, lead the team conference, and concur with the assessment and plan of the team.   Chana Bode B 06/29/2021, 2:43 PM

## 2021-06-29 NOTE — Progress Notes (Signed)
Physical Therapy Session Note  Patient Details  Name: Christian Russell MRN: [id number] Date of Birth: 1970/10/27  Today's Date: 06/29/2021 PT Individual Time: 3846-6599 PT Individual Time Calculation (min): 58 min   Short Term Goals: Week 2:  PT Short Term Goal 1 (Week 2): Pt will complete bed mobility with CGA PT Short Term Goal 2 (Week 2): Pt will complete bed<>chair transfers with minA and LRAD PT Short Term Goal 3 (Week 2): Pt will ambulate 155ft with minA and LRAD PT Short Term Goal 4 (Week 2): Pt will initiate stair training  Skilled Therapeutic Interventions/Progress Updates:     Pt sitting EOB at start of session. Notified by SLP prior to entering room that pt was frustrated with bed alarm and feels that it is restricting his ability to do anything. Pt in an irritable mood and spent some time educating him on fall prevention and safety, ultimately had to redirect patient. Donned aircast to RLE and shoe to RLE with totalA for time. Assisted to w/c with minA for squat<>pivot transfer to his weaker R side - cues needed for setup and safety. Pt propelled himself mod I in w/c via hemi technique from his room downstairs (via elevator) to 90M rehab gym. Focused on stair training where he navigated up/down 12 + 8 steps (seated rest) with light minA and 1 hand rail. Able to recall stepping sequencing without cues. TR present for part of session and discussed returning to home and DC planning. Pt then worked on gait training in hallway, ambulating ~53ft with x2 turns with CGA and wide based quad cane. Cues for increasing step length and monitoring R foot placement to avoid overcrowding. Completed ambulatory car transfer with minA and QC with cues needed for safety technique and car height set to simulate his mothers car. Pt returned upstairs to his room in w/c for time and assisted back to bed via squat<>pivot transfer to his L side with CGA. Remained seated EOB with bed alarm on, all needs in  reach.  Therapy Documentation Precautions:  Precautions Precautions: Fall Precaution Comments: fall, slightly impulsive, right sided neglect Restrictions Weight Bearing Restrictions: No General:     Therapy/Group: Individual Therapy  Orrin Brigham 06/29/2021, 7:42 AM

## 2021-06-29 NOTE — Progress Notes (Signed)
Occupational Therapy Session Note  Patient Details  Name: HANNAH CRILL MRN: [id number] Date of Birth: [birth year]  Today's Date: 06/29/2021 OT Individual Time: 1000-1045 OT Individual Time Calculation (min): 45 min    Short Term Goals: Week 1:  OT Short Term Goal 1 (Week 1): Pt will complete sit <>stand transfer with LRAD needing min assist in prep for self care. OT Short Term Goal 1 - Progress (Week 1): Met OT Short Term Goal 2 (Week 1): Pt will donn/doff pants with CGA. OT Short Term Goal 2 - Progress (Week 1): Met OT Short Term Goal 3 (Week 1): Pt will complete tub shower transfer with min assist. OT Short Term Goal 3 - Progress (Week 1): Met OT Short Term Goal 4 (Week 1): Pt will increase RUE MMT to 3+/5 to facilitate improved independence with functional transfers. OT Short Term Goal 4 - Progress (Week 1): Revised due to lack of progress  Skilled Therapeutic Interventions/Progress Updates:     Pt received in bed with no pain reported   ADL: PT propels in hallway to/from room with set up and increased time. Pt educated on strategies to step over edge of tub as pt does not have grab bar (anticipate planned failure since pt only has suction grab bars). Pt getting frustrated refusing to practice eventually admitting it is not safe but needing encouragement to articulate he does not feel it is safe. Pt agreeable to purchase of TTB on [employer] and will do on fam ed day  when mother brings wallet.   Therapeutic activity Standing balance CGA at high low table with focus of functional reach, grasp/release, finger>palm translation and turning over of blocks for RUE NMR with tactile cue to decrease shoulder hike with reaching.  Pt left at end of session in bed with exit alarm on, call light in reach and all needs met   Therapy Documentation Precautions:  Precautions Precautions: Fall Precaution Comments: fall, slightly impulsive, right sided neglect Restrictions Weight Bearing  Restrictions: No   Therapy/Group: 1:1  Tonny Branch 06/29/2021, 6:56 AM

## 2021-06-29 NOTE — Progress Notes (Signed)
PROGRESS NOTE   Subjective/Complaints: Asks about his medications upon discharged. Discussed that we would give him one month supply of all medications except for oxycodone for which we could give him one week. He is trying to establish follow-up appointments with his PCP and pain mangement   ROS:  Pt denies SOB, abd pain, CP, N/V/C/D, and vision changes, +left wrist pain, +low back pain   Objective:   No results found. No results for input(s): WBC, HGB, HCT, PLT in the last 72 hours.  No results for input(s): NA, K, CL, CO2, GLUCOSE, BUN, CREATININE, CALCIUM in the last 72 hours.   Intake/Output Summary (Last 24 hours) at 06/29/2021 1315 Last data filed at 06/29/2021 1302 Gross per 24 hour  Intake 1006 ml  Output 400 ml  Net 606 ml        Physical Exam: Vital Signs Blood pressure 123/83, pulse 82, temperature 97.6 F (36.4 C), resp. rate 17, height 5\' 4"  (1.626 m), weight 68.9 kg, SpO2 100 %.  Gen: no distress, normal appearing HEENT: oral mucosa pink and moist, NCAT Cardio: Reg rate Chest: normal effort, normal rate of breathing Abd: soft, non-distended Ext: no edema  Psych: irritable and upset about lunch, and various complaints Skin: intact Musculoskeletal: Right sided strength 2/5 EF and EE, 3/5 HF and KE, 0/5 WE and DF +TTP left wrist- per pt no change   Assessment/Plan: 1. Functional deficits which require 3+ hours per day of interdisciplinary therapy in a comprehensive inpatient rehab setting. Physiatrist is providing close team supervision and 24 hour management of active medical problems listed below. Physiatrist and rehab team continue to assess barriers to discharge/monitor patient progress toward functional and medical goals  Care Tool:  Bathing              Bathing assist Assist Level: Moderate Assistance - Patient 50 - 74%     Upper Body Dressing/Undressing Upper body dressing         Upper body assist Assist Level: Minimal Assistance - Patient > 75%    Lower Body Dressing/Undressing Lower body dressing            Lower body assist Assist for lower body dressing: Moderate Assistance - Patient 50 - 74%     Toileting Toileting    Toileting assist Assist for toileting: Maximal Assistance - Patient 25 - 49%     Transfers Chair/bed transfer  Transfers assist     Chair/bed transfer assist level: Moderate Assistance - Patient 50 - 74%     Locomotion Ambulation   Ambulation assist      Assist level: Minimal Assistance - Patient > 75% Assistive device: Cane-quad Max distance: 159ft   Walk 10 feet activity   Assist  Walk 10 feet activity did not occur: Safety/medical concerns  Assist level: Minimal Assistance - Patient > 75% Assistive device: Cane-quad   Walk 50 feet activity   Assist Walk 50 feet with 2 turns activity did not occur: Safety/medical concerns  Assist level: Minimal Assistance - Patient > 75% Assistive device: Cane-quad    Walk 150 feet activity   Assist Walk 150 feet activity did not occur: Safety/medical concerns  Walk 10 feet on uneven surface  activity   Assist Walk 10 feet on uneven surfaces activity did not occur: Safety/medical concerns         Wheelchair     Assist Is the patient using a wheelchair?: Yes Type of Wheelchair: Manual    Wheelchair assist level: Dependent - Patient 0% Max wheelchair distance: 150    Wheelchair 50 feet with 2 turns activity    Assist        Assist Level: Dependent - Patient 0%   Wheelchair 150 feet activity     Assist      Assist Level: Dependent - Patient 0%   Blood pressure 123/83, pulse 82, temperature 97.6 F (36.4 C), resp. rate 17, height 5\' 4"  (1.626 m), weight 68.9 kg, SpO2 100 %.  Medical Problem List and Plan: 1.  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  Antithrombotics: -DVT/anticoagulation:  Mechanical:  Antiembolism stockings, knee (TED hose) Bilateral lower extremities             -antiplatelet therapy: N/A 3. Low back pain: Continue Oxycodone 10mg  q6H prn- was upset when decreased, can continue until d/c, and then discussed with him wean for home as we can only given him 1 week supply of medication. Robaxin-750 milligram every 6 hours as needed muscle spasms. Continue kpad for lower back and neck pain 4. Insomnia: Team to provide emotional support.  Continue Melatonin 3 mg nightly as well as Restoril.               -antipsychotic agents: N/A             -pt has worked 3rd shift for 20 years and finds it difficult to sleep at night                         -continue above meds and observe 5. Neuropsych: This patient is capable of making decisions on his own behalf. 6. Skin/Wound Care: Routine skin checks 7. Fluids/Electrolytes/Nutrition: Routine in and outs with follow-up chemistries 8.  Hypertension. Monitor with increased mobility  D/c norvasc- BP remains excellent  10/30- BP actually al ittle soft- con't regimen 9.  Hyperlipidemia.  TG and LDL elevated. Continue Lipitor 10.  Urine drug screen positive marijuana.  Provide counseling 11. Hyperglycemia: educated to avoid drinks and foods with added sugar.  12. Lower extremity spasms: No longer bothersome, d/c balcofen 13. Therapeutic grounds pass to see friends and dog on grounds for his birthday today 56. Agitation regarding food/episode where he could not use bedside commode, being asked to use right hand while eating, decrease in pain medication: comforted and concerns discussed with staff. Much better mood today 15. Right ankle weakness/rolling: aircast  ordered.  16. 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 Pain likely secondary to overuse- continue to encourage use of right upper extremity to improve strength and function. Lidocaine cream and wrist splint added- continue to use as needed 17. Overweight BMI 26.07: provide dietary education 18.  Disposition: Messaged team to see if patient can leave Friday. HFU scheduled with me on 12/2.   LOS: 13 days A FACE TO FACE EVALUATION WAS PERFORMED  Wiliam Cauthorn P Alazne Quant 06/29/2021, 1:15 PM

## 2021-06-29 NOTE — Progress Notes (Signed)
Occupational Therapy Session Note  Patient Details  Name: Christian Russell MRN: [id number] Date of Birth: Apr 09, 1971  Today's Date: 06/29/2021 OT Individual Time: 0905-1000 OT Individual Time Calculation (min): 55 min    Short Term Goals: Week 2:  OT Short Term Goal 1 (Week 2): Pt will complete tub shower transfer with supervision OT Short Term Goal 2 (Week 2): Pt will complete LB dressing with supervision OT Short Term Goal 3 (Week 2): Pt will be able to apply toothpaste using right hand to toothbrush. OT Short Term Goal 4 (Week 2): Pt will be able to retrieve clothing from drawers using RUE.  Skilled Therapeutic Interventions/Progress Updates:    Pt sitting EOB with clothes already laid out ready to take a shower. Pt reported during session that his shower bench "is completely inside the tub" and he would have to step over to get in. Stand pivot to w/c and self propulsion to bathroom with mod I.  Pt completed shower bench transfer with supervision.  Bathing UB/LB using wash mitt on right hand with distant supervision seated and using lateral leans to wash buttocks.  Pt did drop soap bottle on ground needing stand by to retrieve. Pt reports his mom will be available to offer stand by assist during bathing tasks.  Pt transferred out of shower with supervision and ambulated from bathroom to EOB approximately 10 feet with min assist using quad cane without aircast donned due to pt insisting he can complete mobility without.  Noted significant ankle inversion and instability during ambulation.  Pt reports increased awareness and stating maybe he should use w/c and/or aircast when moving from bathroom to bedroom at home.  Pt dressed UB and LB with mod I.  Educated pt on RUE weightbearing activity sitting EOB to improve proprioceptive input using left hand to support behind elbow.  Call bell in reach, bed alarm on.  Therapy Documentation Precautions:  Precautions Precautions: Fall Precaution Comments:  fall, slightly impulsive, right sided neglect Restrictions Weight Bearing Restrictions: No   Therapy/Group: Individual Therapy  Amie Critchley 06/29/2021, 12:58 PM
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
## 2021-06-30 MED ORDER — METHOCARBAMOL 750 MG PO TABS: 750.0000 mg | ORAL_TABLET | Freq: Four times a day (QID) | ORAL | 0 refills | Status: DC | PRN

## 2021-06-30 MED ORDER — ACETAMINOPHEN 325 MG PO TABS: 325.0000 mg | ORAL_TABLET | ORAL | Status: AC | PRN

## 2021-06-30 MED ORDER — MELATONIN 3 MG PO TABS: 3.0000 mg | ORAL_TABLET | Freq: Every day | ORAL | 0 refills | Status: AC

## 2021-06-30 MED ORDER — DICLOFENAC SODIUM 1 % EX GEL: 2.0000 g | Freq: Four times a day (QID) | CUTANEOUS | 0 refills | Status: AC

## 2021-06-30 MED ORDER — OXYCODONE HCL 10 MG PO TABS: 10.0000 mg | ORAL_TABLET | Freq: Four times a day (QID) | ORAL | 0 refills | Status: AC | PRN

## 2021-06-30 MED ORDER — PANTOPRAZOLE SODIUM 40 MG PO TBEC: 40.0000 mg | DELAYED_RELEASE_TABLET | Freq: Every day | ORAL | 0 refills | Status: AC

## 2021-06-30 MED ORDER — ATORVASTATIN CALCIUM 80 MG PO TABS: 80.0000 mg | ORAL_TABLET | Freq: Every day | ORAL | 0 refills | Status: AC

## 2021-06-30 NOTE — Progress Notes (Signed)
Occupational Therapy Session Note  Patient Details  Name: Christian Russell MRN: [id number] Date of Birth: [birth year]  Today's Date: 06/30/2021 OT Individual Time: 0905-1000 OT Individual Time Calculation (min): 55 min    Short Term Goals: Week 2:  OT Short Term Goal 1 (Week 2): Pt will complete tub shower transfer with supervision OT Short Term Goal 2 (Week 2): Pt will complete LB dressing with supervision OT Short Term Goal 3 (Week 2): Pt will be able to apply toothpaste using right hand to toothbrush. OT Short Term Goal 4 (Week 2): Pt will be able to retrieve clothing from drawers using RUE.   Skilled Therapeutic Interventions/Progress Updates:    Pt sitting up EOB, reporting he needs to go home today instead of tomorrow due to mom (who is picking him up) has schedule conflicts and also he reports having urgent errands to attend to. Interdisciplinary collaboration completed to ensure safe dc planning.  Pt required min assist to donn right shoe due to aircast getting caught laterally and unable to reach.  Donned socks and left shoe with mod I.  Pt completed stand pivot EOB to w/c and self propelled to sink with mod I.  Also brushed teeth and washed face with mod.  Assessed BUE coordination, strength, ROM, and BIMs for dc.  Pt self propelled to gym and completed 10 minutes on UBE level 3 propelling with RUE and BLE.  Squat pivot w/c<>nustep with mod I. Returned to room, call bell in reach, alarm on.  Therapy Documentation Precautions:  Precautions Precautions: Fall Precaution Comments: R inattention, R ankle instability, poor frustration/pain tolerance, tangential speech Required Braces or Orthoses: Splint/Cast Splint/Cast: AIRCAST Restrictions Weight Bearing Restrictions: No    Therapy/Group: Individual Therapy  Amie Critchley 06/30/2021, 12:51 PM

## 2021-06-30 NOTE — Plan of Care (Deleted)
  Problem: RH Problem Solving Goal: LTG Patient will demonstrate problem solving for (SLP) Description: LTG:  Patient will demonstrate problem solving for basic/complex daily situations with cues  (SLP) Outcome: Adequate for Discharge   Problem: RH Memory Goal: LTG Patient will demonstrate ability for day to day (SLP) Description: LTG:   Patient will demonstrate ability for day to day recall/carryover during cognitive/linguistic activities with assist  (SLP) Outcome: Adequate for Discharge   Problem: RH Attention Goal: LTG Patient will demonstrate this level of attention during functional activites (SLP) Description: LTG:  Patient will will demonstrate this level of attention during functional activites (SLP) Outcome: Adequate for Discharge

## 2021-06-30 NOTE — Progress Notes (Signed)
Speech Language Pathology Discharge Summary  Patient Details  Name: Christian Russell MRN: [id number] Date of Birth: 1970/12/24  Patient has met 3 of 6 long term goals.  Patient to discharge at overall Modified Independent;Supervision level.  Reasons goals not met: LOS was reduced, slow progress, difficult to redirect   Clinical Impression/Discharge Summary:   Patient has made slow yet functional  gains and has met 3 of 6 long-term goals this admission. Goals not achieved due to shorter length of stay than initially anticipated, and difficulty with redirection at times due to perseverative nature. Patient is currently an overall mod I-to-sup A for cognition in the areas of attention, problem solving, memory, and awareness. Pt implements speech intelligibility strategies to achieve 100% intelligibility with modified independence. Education is complete and patient to discharge at overall mod I-to-sup A level. Patient's care partner is independent to provide the necessary physical and cognitive assistance at discharge. Patient would benefit from continued SLP services to maximize cognitive function and functional independence.    Care Partner:  Caregiver Able to Provide Assistance: Yes  Type of Caregiver Assistance: Physical;Cognitive  Recommendation:  Outpatient SLP  Rationale for SLP Follow Up: Maximize cognitive function and independence   Equipment:   NA  Reasons for discharge: Discharged from hospital   Patient/Family Agrees with Progress Made and Goals Achieved: Yes   Patty Sermons 06/30/2021, 12:38 PM

## 2021-06-30 NOTE — Progress Notes (Signed)
PROGRESS NOTE   Subjective/Complaints: Mr. Mainwaring requests d/c today given finances. His Mom will be able to come in today for caregiver training with PT. Medically stable for d/c. Appreciate Dan's assistance with disability paperwork   ROS:  Pt denies SOB, abd pain, CP, N/V/C/D, and vision changes, +left wrist pain, +low back pain   Objective:   No results found. No results for input(s): WBC, HGB, HCT, PLT in the last 72 hours.  No results for input(s): NA, K, CL, CO2, GLUCOSE, BUN, CREATININE, CALCIUM in the last 72 hours.   Intake/Output Summary (Last 24 hours) at 06/30/2021 1149 Last data filed at 06/30/2021 1115 Gross per 24 hour  Intake 829 ml  Output 1800 ml  Net -971 ml        Physical Exam: Vital Signs Blood pressure 122/85, pulse 73, temperature 97.6 F (36.4 C), resp. rate 18, height 5\' 4"  (1.626 m), weight 68.9 kg, SpO2 98 %.  Gen: no distress, normal appearing HEENT: oral mucosa pink and moist, NCAT Cardio: Reg rate Chest: normal effort, normal rate of breathing Abd: soft, non-distended Ext: no edema  Psych: irritable and upset about lunch, and various complaints Skin: intact Musculoskeletal: Right sided strength 2/5 EF and EE, 3/5 HF and KE, 0/5 WE and DF +TTP left wrist- per pt no change   Assessment/Plan: 1. Functional deficits which require 3+ hours per day of interdisciplinary therapy in a comprehensive inpatient rehab setting. Physiatrist is providing close team supervision and 24 hour management of active medical problems listed below. Physiatrist and rehab team continue to assess barriers to discharge/monitor patient progress toward functional and medical goals  Care Tool:  Bathing              Bathing assist Assist Level: Moderate Assistance - Patient 50 - 74%     Upper Body Dressing/Undressing Upper body dressing        Upper body assist Assist Level: Minimal Assistance -  Patient > 75%    Lower Body Dressing/Undressing Lower body dressing            Lower body assist Assist for lower body dressing: Moderate Assistance - Patient 50 - 74%     Toileting Toileting    Toileting assist Assist for toileting: Maximal Assistance - Patient 25 - 49%     Transfers Chair/bed transfer  Transfers assist     Chair/bed transfer assist level: Moderate Assistance - Patient 50 - 74%     Locomotion Ambulation   Ambulation assist      Assist level: Minimal Assistance - Patient > 75% Assistive device: Cane-quad Max distance: 157ft   Walk 10 feet activity   Assist  Walk 10 feet activity did not occur: Safety/medical concerns  Assist level: Minimal Assistance - Patient > 75% Assistive device: Cane-quad   Walk 50 feet activity   Assist Walk 50 feet with 2 turns activity did not occur: Safety/medical concerns  Assist level: Minimal Assistance - Patient > 75% Assistive device: Cane-quad    Walk 150 feet activity   Assist Walk 150 feet activity did not occur: Safety/medical concerns         Walk 10 feet on uneven surface  activity   Assist Walk 10 feet on uneven surfaces activity did not occur: Safety/medical concerns         Wheelchair     Assist Is the patient using a wheelchair?: Yes Type of Wheelchair: Manual    Wheelchair assist level: Dependent - Patient 0% Max wheelchair distance: 150    Wheelchair 50 feet with 2 turns activity    Assist        Assist Level: Dependent - Patient 0%   Wheelchair 150 feet activity     Assist      Assist Level: Dependent - Patient 0%   Blood pressure 122/85, pulse 73, temperature 97.6 F (36.4 C), resp. rate 18, height 5\' 4"  (1.626 m), weight 68.9 kg, SpO2 98 %.  Medical Problem List and Plan: 1.  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  Antithrombotics: -DVT/anticoagulation:  Mechanical:  Antiembolism stockings, knee (TED hose) Bilateral lower extremities             -antiplatelet therapy: N/A 3. Low back pain: Continue Oxycodone 10mg  q6H prn- was upset when decreased, can continue until d/c, and then discussed with him wean for home as we can only given him 1 week supply of medication. Robaxin-750 milligram every 6 hours as needed muscle spasms. Continue kpad for lower back and neck pain 4. Insomnia: Team to provide emotional support.  Continue Melatonin 3 mg nightly as well as Restoril.               -antipsychotic agents: N/A             -pt has worked 3rd shift for 20 years and finds it difficult to sleep at night                         -continue above meds and observe 5. Neuropsych: This patient is capable of making decisions on his own behalf. 6. Skin/Wound Care: Routine skin checks 7. Fluids/Electrolytes/Nutrition: Routine in and outs with follow-up chemistries 8.  Hypertension. Monitor with increased mobility  D/c norvasc- BP remains excellent  10/30- BP actually al ittle soft- con't regimen 9.  Hyperlipidemia.  TG and LDL elevated. Continue Lipitor 10.  Urine drug screen positive marijuana.  Provide counseling 11. Hyperglycemia: educated to avoid drinks and foods with added sugar.  12. Lower extremity spasms: No longer bothersome, d/c balcofen 13. Therapeutic grounds pass to see friends and dog on grounds for his birthday today 32. Agitation regarding food/episode where he could not use bedside commode, being asked to use right hand while eating, decrease in pain medication: comforted and concerns discussed with staff. Much better mood today 15. Right ankle weakness/rolling: aircast ordered.  16. 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 Pain likely secondary to  overuse- continue to encourage use of right upper extremity to improve strength and function. Lidocaine cream and wrist splint added- continue to use as needed 17. Overweight BMI 26.07: provide dietary education 18. Disposition: Messaged team to see if patient can leave Friday. HFU scheduled with  me on 12/2.    >30 minutes spent in discharge of patient including review of medications and follow-up appointments, physical examination, and in answering all patient's questions   LOS: 14 days A FACE TO FACE EVALUATION WAS PERFORMED  Clint Bolder P Beverlie Kurihara 06/30/2021, 11:49 AM

## 2021-06-30 NOTE — Discharge Summary (Signed)
Physical Therapy Discharge Summary  Patient Details  Name: Christian Russell MRN: [id number] Date of Birth: 11-05-1970  Patient has met 7 of 8 long term goals due to improved activity tolerance, improved balance, improved postural control, increased strength, ability to compensate for deficits, functional use of  right upper extremity and right lower extremity, and improved awareness.  Patient to discharge at an ambulatory level [place].  Pt's LOS was reduced per patient request as he was eager to return home. Poor frustration and pain tolerance had been limiting therapies at times. Family education was scheduled however his mother only showed up for the last 5 minutes so she was verbally reviewed stairs, aircast, home safety, and safety/fall prevention. Patient's care partner is independent to provide the necessary physical and cognitive assistance at discharge.  Reasons goals not met: Requires CGA for safety during sit<>stand transfers.  Recommendation:  Patient will benefit from ongoing skilled PT services in outpatient setting to continue to advance safe functional mobility, address ongoing impairments in R sided weakness, R inattention, R proprioceptive deficits, R ankle instability, functional mobility, gait training with LRAD, and minimize fall risk.  Equipment: 16x16 w/c. Pt obtaining WBQC on his own  Reasons for discharge: treatment goals met and discharge from hospital  Patient/family agrees with progress made and goals achieved: Yes  PT Discharge Precautions/Restrictions Precautions Precautions: Fall Precaution Comments: R inattention, R ankle instability, poor frustration/pain tolerance, tangential speech Required Braces or Orthoses: Splint/Cast Splint/Cast: AIRCAST Restrictions Weight Bearing Restrictions: No Pain Pain Assessment Pain Scale: 0-10 Pain Score: 6  Pain Type: Chronic pain Pain Location: Back Pain Orientation: Lower Pain Descriptors / Indicators:  Aching;Constant Pain Onset: On-going  Pain Interference Pain Interference Pain Effect on Sleep: 3. Frequently ("every night") Pain Interference with Therapy Activities: 4. Almost constantly Pain Interference with Day-to-Day Activities: 3. Frequently  Vision/Perception  Vision - History Ability to See in Adequate Light: 0 Adequate Vision - Assessment Eye Alignment: Within Functional Limits Ocular Range of Motion: Within Functional Limits Alignment/Gaze Preference: Within Defined Limits Tracking/Visual Pursuits: Able to track stimulus in all quads without difficulty Perception Perception: Impaired Inattention/Neglect: Does not attend to right side of body Praxis Praxis: Intact  Cognition Overall Cognitive Status: Within Functional Limits for tasks assessed Arousal/Alertness: Awake/alert Orientation Level: Oriented X4 Attention: Sustained;Selective;Focused Focused Attention: Appears intact Sustained Attention: Impaired Sustained Attention Impairment: Verbal complex;Functional complex Selective Attention: Appears intact Alternating Attention: Impaired Alternating Attention Impairment: Verbal complex;Functional complex Memory: Appears intact Awareness: Appears intact Problem Solving: Impaired Problem Solving Impairment: Verbal complex;Functional complex Behaviors: Restless;Poor frustration tolerance Safety/Judgment: Impaired Sensation Sensation Light Touch: Impaired by gross assessment Hot/Cold: Appears Intact Proprioception: Impaired by gross assessment Stereognosis: Not tested Additional Comments: Light touch absent on R side Coordination Gross Motor Movements are Fluid and Coordinated: No Fine Motor Movements are Fluid and Coordinated: No Finger Nose Finger Test: Impaired RUE, but improved since IE; WNL LUE Motor  Motor Motor: Hemiplegia Motor - Skilled Clinical Observations: R hemi (UE > LE)  Mobility Bed Mobility Bed Mobility: Rolling Right;Rolling Left;Sit to  Supine;Supine to Sit Rolling Right: Independent Rolling Left: Independent Supine to Sit: Independent Sit to Supine: Independent Transfers Transfers: Sit to Stand;Stand to Sit;Squat Pivot Transfers Sit to Stand: Contact Guard/Touching assist Stand to Sit: Supervision/Verbal cueing Squat Pivot Transfers: Minimal Assistance - Patient > 75%;Contact Guard/Touching assist (minA to weaker R side, CGA to stronger L side) Transfer (Assistive device): None Locomotion  Gait Ambulation: Yes Gait Assistance: Minimal Assistance - Patient > 75%;Contact Guard/Touching assist Gait  Distance (Feet): 150 Feet Assistive device: Large base quad cane Gait Assistance Details: Verbal cues for gait pattern;Verbal cues for precautions/safety;Verbal cues for technique;Verbal cues for safe use of DME/AE;Manual facilitation for weight shifting Gait Gait: Yes Gait Pattern: Impaired Gait Pattern: Decreased step length - right;Decreased stance time - right;Decreased hip/knee flexion - right;Narrow base of support;Poor foot clearance - right;Decreased weight shift to right;Decreased dorsiflexion - right;Step-through pattern;Right hip hike Gait velocity: decreased Stairs / Additional Locomotion Stairs: Yes Stairs Assistance: Minimal Assistance - Patient > 75% Stair Management Technique: Step to pattern;One rail Left Number of Stairs: 12 Height of Stairs: 6 Wheelchair Mobility Wheelchair Mobility: Yes Wheelchair Assistance: Independent with assistive device Wheelchair Propulsion: Left upper extremity;Left lower extremity Wheelchair Parts Management: Needs assistance  Trunk/Postural Assessment  Cervical Assessment Cervical Assessment: Within Functional Limits Thoracic Assessment Thoracic Assessment: Within Functional Limits Lumbar Assessment Lumbar Assessment: Within Functional Limits Postural Control Postural Control: Within Functional Limits  Balance Balance Balance Assessed: Yes Static Sitting  Balance Static Sitting - Balance Support: Feet supported;No upper extremity supported Static Sitting - Level of Assistance: 7: Independent Dynamic Sitting Balance Dynamic Sitting - Balance Support: Feet supported Dynamic Sitting - Level of Assistance: 7: Independent Static Standing Balance Static Standing - Balance Support: Left upper extremity supported Static Standing - Level of Assistance: 5: Stand by assistance Dynamic Standing Balance Dynamic Standing - Balance Support: During functional activity;Left upper extremity supported Dynamic Standing - Level of Assistance: 4: Min assist Extremity Assessment  RUE Assessment RUE Assessment: Exceptions to [facility] Passive Range of Motion (PROM) Comments: WNL Active Range of Motion (AROM) Comments: WFL General Strength Comments: 3+/5 RUE Body System: Neuro Brunstrum levels for arm and hand: Arm;Hand Brunstrum level for arm: Stage IV Movement is deviating from synergy Brunstrum level for hand: Stage V Independence from basic synergies LUE Assessment LUE Assessment: Within Functional Limits RLE Assessment RLE Assessment: Exceptions to [facility] RLE Strength RLE Overall Strength: Deficits Right Hip Flexion: 4-/5 Right Knee Flexion: 3+/5 Right Knee Extension: 4/5 Right Ankle Dorsiflexion: 2+/5 LLE Assessment LLE Assessment: Within Functional Limits    Elysia Grand P Zyria Fiscus PT, DPT 06/30/2021, 7:57 AM

## 2021-06-30 NOTE — Progress Notes (Signed)
Inpatient Rehabilitation Care Coordinator Discharge Note   Patient Details  Name: Christian Russell MRN: [id number] Date of Birth: [birth year]   Discharge location: HOME WITH MOM WHO CAN PROVIDE SUPERVISION-CGA LEVEL ASSIST  Length of Stay: 14 DAYS  Discharge activity level: SUPERVISION-CGA LEVEL  Home/community participation: ACTIVE  Patient response DZ:HGDJME Literacy - How often do you need to have someone help you when you read instructions, pamphlets, or other written material from your doctor or pharmacy?: Rarely  Patient response QA:STMHDQ Isolation - How often do you feel lonely or isolated from those around you?: Never  Services provided included: MD, RD, PT, OT, SLP, RN, CM, TR, Pharmacy, SW  Financial Services:  Financial Services Utilized: [employer]  Choices offered to/list presented to: PT AND MOM  Follow-up services arranged:  Outpatient    Outpatient Servicies: [facility]-PT,OT,SP WILL CALL PT AND/OR MOM TO SET UP APPOINTMENTS [facility]      Patient response to transportation need: Is the patient able to respond to transportation needs?: Yes In the past 12 months, has lack of transportation kept you from medical appointments or from getting medications?: No In the past 12 months, has lack of transportation kept you from meetings, work, or from getting things needed for daily living?: No    Comments (or additional information): MOM WAS IN FOR PT SESSION ON DAY OF DC AND FEELS COMFORTABLE WITH CARE. PT REQUESTED TO DC DAY SOONER AND MD AND TEAM IN AGREEMENT.  Patient/Family verbalized understanding of follow-up arrangements:  Yes  Individual responsible for coordination of the follow-up plan: BRENDA-MOM [phone]  Confirmed correct DME delivered: [name] 06/30/2021    Keshav Winegar, Lemar Livings

## 2021-06-30 NOTE — Progress Notes (Signed)
Physical Therapy Session Note  Patient Details  Name: Christian Russell MRN: [id number] Date of Birth: 09-01-1970  Today's Date: 06/30/2021 PT Individual Time: 1300-1400 PT Individual Time Calculation (min): 60 min   Short Term Goals: Week 2:  PT Short Term Goal 1 (Week 2): Pt will complete bed mobility with CGA PT Short Term Goal 2 (Week 2): Pt will complete bed<>chair transfers with minA and LRAD PT Short Term Goal 3 (Week 2): Pt will ambulate 115f with minA and LRAD PT Short Term Goal 4 (Week 2): Pt will initiate stair training  Skilled Therapeutic Interventions/Progress Updates:     Pt received sitting in his w/c (chariot III) that was delivered to room by [employer]. Fits him appropriately with hip and leg length. Pt reports his mom is running late for family education due to car problems. Pt propelled himself mod I in w/c from his room to 78M rehab gym, >2067f including getting on/off elevator. In rehab gym, discussed DC planning, home safety, fall prevention, use of AIRCAST with all mobility, w/c parts and leg rest management, folding w/c, role of f/u therapies, etc. Pt reports he has a long handled shoe horn to assist with shoe's and aircast. Completed DC assessment which is reflected in DC summary. He completed functional gait training in hallways with QC where he required CGA for straight path gait and minA for turns, especially while turning to sit - education provided on safety awareness, and importance of maintaining awareness of LLE during functional mobility to reduce risk of falls. Next, worked on fuNeurosurgeon he navigated up/down x12 steps with light minA and 1 hand rail, 6inch steps, step-to pattern with L foot leading ascent and R foot leading descent - did not require any cues for sequencing with good recall of education from prior sessions for stair training. Pt returned upstairs in w/c for time. His mom was in the room and spent last few minutes spending time educating her  on donning/doffing AIRCAST, verbally reviewing pt's current mobility, status, and verbally reviewing stair sequencing. Pt's mother voiced understanding and all needs met at end of session. No further questions/concerns from neither patient nor mother.   Therapy Documentation Precautions:  Precautions Precautions: Fall Precaution Comments: fall, slightly impulsive, right sided neglect Restrictions Weight Bearing Restrictions: No General:    Therapy/Group: Individual Therapy  ChAlger Simons1/10/2020, 7:44 AM

## 2021-06-30 NOTE — Plan of Care (Signed)
Problem: RH Problem Solving Goal: LTG Patient will demonstrate problem solving for (SLP) Description: LTG:  Patient will demonstrate problem solving for basic/complex daily situations with cues  (SLP) 06/30/2021 1229 by Charna Elizabeth T, CCC-SLP Outcome: Adequate for Discharge  Problem: RH Memory Goal: LTG Patient will demonstrate ability for day to day (SLP) Description: LTG:   Patient will demonstrate ability for day to day recall/carryover during cognitive/linguistic activities with assist  (SLP) 06/30/2021 1229 by Charna Elizabeth T, CCC-SLP Outcome: Adequate for Discharge   Problem: RH Attention Goal: LTG Patient will demonstrate this level of attention during functional activites (SLP) Description: LTG:  Patient will will demonstrate this level of attention during functional activites (SLP) 06/30/2021 1229 by Charna Elizabeth T, CCC-SLP Outcome: Adequate for Discharge  Problem: RH Expression Communication Goal: LTG Patient will increase speech intelligibility (SLP) Description: LTG: Patient will increase speech intelligibility at word/phrase/conversation level with cues, % of the time (SLP) 06/30/2021 1229 by Charna Elizabeth T, CCC-SLP Outcome: Completed/Met  Problem: RH Memory Goal: LTG Patient will use memory compensatory aids to (SLP) Description: LTG:  Patient will use memory compensatory aids to recall biographical/new, daily complex information with cues (SLP) 06/30/2021 1229 by Charna Elizabeth T, CCC-SLP Outcome: Completed/Met  Problem: RH Awareness Goal: LTG: Patient will demonstrate awareness during functional activites type of (SLP) Description: LTG: Patient will demonstrate awareness during functional activites type of (SLP) 06/30/2021 1229 by Charna Elizabeth T, CCC-SLP Outcome: Completed/Met

## 2021-06-30 NOTE — Progress Notes (Signed)
Occupational Therapy Discharge Summary  Patient Details  Name: Christian Russell MRN: [id number] Date of Birth: [birth year]  Today's Date: 06/30/2021   Patient has met 9 of 9 long term goals due to improved activity tolerance, improved balance, postural control, ability to compensate for deficits, functional use of  RIGHT upper and RIGHT lower extremity, improved awareness, and improved coordination.  Patient to discharge at overall Supervision level.  Patient's care partner is independent to provide the necessary cognitive assistance at discharge.    Recommendation:  Patient will benefit from ongoing skilled OT services in outpatient setting to continue to advance functional skills in the area of BADL.  Equipment: W/c  Reasons for discharge: treatment goals met and discharge from hospital  Patient/family agrees with progress made and goals achieved: Yes  OT Discharge Precautions/Restrictions  Precautions Precautions: Fall Precaution Comments: R inattention, R ankle instability, poor frustration/pain tolerance, tangential speech Required Braces or Orthoses: Splint/Cast Splint/Cast: AIRCAST Restrictions Weight Bearing Restrictions: No Pain Pain Assessment Pain Scale: 0-10 Pain Score: 0-No pain ADL ADL Grooming: Modified independent Where Assessed-Grooming: Sitting at sink Upper Body Bathing: Modified independent Where Assessed-Upper Body Bathing: Shower Lower Body Bathing: Modified independent Where Assessed-Lower Body Bathing: Shower Upper Body Dressing: Modified independent (Device) Where Assessed-Upper Body Dressing: Edge of bed Lower Body Dressing: Minimal assistance Where Assessed-Lower Body Dressing: Edge of bed Toileting: Modified independent Where Assessed-Toileting: Bedside Commode Toilet Transfer: Modified independent Toilet Transfer Method: Engineer, water: Bedside commode Tub/Shower Transfer: Close supervison Clinical cytogeneticist Method:  Librarian, academic: Facilities manager: Minimal assistance Social research officer, government Method: Education officer, environmental: Radio broadcast assistant Vision Baseline Vision/History: 1 Wears glasses Patient Visual Report: No change from baseline Vision Assessment?: No apparent visual deficits Eye Alignment: Within Functional Limits Ocular Range of Motion: Within Functional Limits Alignment/Gaze Preference: Within Defined Limits Tracking/Visual Pursuits: Able to track stimulus in all quads without difficulty Convergence: Within functional limits Perception  Perception: Impaired Inattention/Neglect: Does not attend to right side of body Praxis Praxis: Intact Cognition Overall Cognitive Status: Impaired/Different from baseline Arousal/Alertness: Awake/alert Orientation Level: Oriented X4 Year: 2022 Month: November Day of Week: Correct Attention: Sustained;Selective;Focused Focused Attention: Appears intact Sustained Attention: Impaired Sustained Attention Impairment: Verbal complex;Functional complex;Functional basic Selective Attention: Impaired Selective Attention Impairment: Verbal complex;Functional basic;Functional complex Alternating Attention: Impaired Alternating Attention Impairment: Verbal complex;Functional complex;Functional basic Memory: Impaired Memory Impairment: Storage deficit;Retrieval deficit Immediate Memory Recall: Sock;Blue;Bed Memory Recall Sock: Without Cue Memory Recall Blue: Without Cue Memory Recall Bed: Without Cue Awareness: Appears intact Awareness Impairment: Anticipatory impairment Problem Solving: Impaired Problem Solving Impairment: Verbal complex;Functional complex Executive Function: Self Monitoring Self Monitoring: Impaired Self Monitoring Impairment: Verbal complex;Functional complex Behaviors: Restless;Poor frustration tolerance Safety/Judgment: Impaired Sensation Sensation Light Touch: Impaired  by gross assessment Hot/Cold: Appears Intact Proprioception: Impaired by gross assessment Stereognosis: Not tested Additional Comments: Able to discern light touch on R side but difficult with accuracy Coordination Gross Motor Movements are Fluid and Coordinated: No Fine Motor Movements are Fluid and Coordinated: No Finger Nose Finger Test: Impaired RUE, but improved since IE; WNL LUE Motor  Motor Motor: Hemiplegia Motor - Skilled Clinical Observations: R hemi (UE > LE) Mobility  Bed Mobility Bed Mobility: Rolling Right;Rolling Left;Sit to Supine;Supine to Sit Rolling Right: Independent Rolling Left: Independent Supine to Sit: Independent Sit to Supine: Independent Transfers Sit to Stand: Contact Guard/Touching assist Stand to Sit: Supervision/Verbal cueing  Trunk/Postural Assessment  Cervical Assessment Cervical Assessment: Within Functional Limits Thoracic Assessment  Thoracic Assessment: Within Functional Limits Lumbar Assessment Lumbar Assessment: Within Functional Limits Postural Control Postural Control: Within Functional Limits  Balance Balance Balance Assessed: Yes Static Sitting Balance Static Sitting - Balance Support: Feet supported;No upper extremity supported Static Sitting - Level of Assistance: 7: Independent Dynamic Sitting Balance Dynamic Sitting - Balance Support: Feet supported Dynamic Sitting - Level of Assistance: 7: Independent Static Standing Balance Static Standing - Balance Support: Left upper extremity supported Static Standing - Level of Assistance: 5: Stand by assistance Dynamic Standing Balance Dynamic Standing - Balance Support: During functional activity;Left upper extremity supported Dynamic Standing - Level of Assistance: 4: Min assist Extremity/Trunk Assessment RUE Assessment RUE Assessment: Exceptions to [facility] Passive Range of Motion (PROM) Comments: WNL Active Range of Motion (AROM) Comments: WFL General Strength Comments: 3+/5 RUE  Body System: Neuro Brunstrum levels for arm and hand: Arm;Hand Brunstrum level for arm: Stage IV Movement is deviating from synergy Brunstrum level for hand: Stage V Independence from basic synergies LUE Assessment LUE Assessment: Within Functional Limits   Ezekiel Slocumb 06/30/2021, 12:52 PM

## 2021-06-30 NOTE — Progress Notes (Signed)
INPATIENT REHABILITATION DISCHARGE NOTE   Discharge instructions by: Jesusita Oka Russell  Verbalized understanding:yes  Skin care/Wound care healing?none  Pain:none  IV's:none  Tubes/Drains:none  O2:none  Safety instructions:done  Patient belongings:none  Discharged ZO:[id number]  Discharged EAV:[id number]  Notes:Christian Russell notified. Instructions were done. Patient has no meds in pharmacy. Patient has no questions.

## 2021-06-30 NOTE — Progress Notes (Signed)
Occupational Therapy Session Note  Patient Details  Name: Christian Russell MRN: [id number] Date of Birth: Aug 21, 1971   Short Term Goals: Week 2:  OT Short Term Goal 1 (Week 2): Pt will complete tub shower transfer with supervision OT Short Term Goal 2 (Week 2): Pt will complete LB dressing with supervision OT Short Term Goal 3 (Week 2): Pt will be able to apply toothpaste using right hand to toothbrush. OT Short Term Goal 4 (Week 2): Pt will be able to retrieve clothing from drawers using RUE.  Skilled Therapeutic Interventions/Progress Updates:  RN sent message via secure chat about patient wanting to "cancel" OT treatment session. When OT entered room, patient reported need to take urgent call at same time as the start of OT treatment session. OT obliged. Upon return to room later this afternoon patient unavailable. Patient missed 60 min of OT treatment session.   Therapy Documentation Precautions:  Precautions Precautions: Fall Precaution Comments: R inattention, R ankle instability, poor frustration/pain tolerance, tangential speech Required Braces or Orthoses: Splint/Cast Splint/Cast: AIRCAST Restrictions Weight Bearing Restrictions: No General: General OT Amount of Missed Time: 60 Minutes  Therapy/Group: Individual Therapy  Nao Linz R Howerton-Davis 06/30/2021, 8:39 AM

## 2021-06-30 NOTE — Progress Notes (Signed)
Patient ID: Christian Russell, male   DOB: 04-06-1971, 50 y.o.   MRN: [id number]  Pt is requesting to discharge today due to worried about finances and getting his STD forms completed. His Mom will come in today for afternoon PT session for training. Team and MD on board and will plan for discharge today after training.

## 2021-07-01 ENCOUNTER — Encounter: Payer: Self-pay | Admitting: Physical Medicine and Rehabilitation

## 2021-07-04 ENCOUNTER — Telehealth: Payer: Self-pay

## 2021-07-04 NOTE — Telephone Encounter (Signed)
Patient called and stated he needed his medications sent to [facility] because [facility] will not take his insurance. The patient stated on the message to call back on the number we have listed in the chart. Attempted to call but mailbox is full

## 2021-07-26 ENCOUNTER — Ambulatory Visit (HOSPITAL_COMMUNITY): Payer: BC Managed Care – PPO | Admitting: Occupational Therapy

## 2021-07-26 ENCOUNTER — Ambulatory Visit (HOSPITAL_COMMUNITY): Payer: BC Managed Care – PPO | Admitting: Physical Therapy

## 2021-07-29 ENCOUNTER — Encounter: Payer: BC Managed Care – PPO | Admitting: Physical Medicine and Rehabilitation

## 2021-07-29 ENCOUNTER — Encounter (HOSPITAL_COMMUNITY): Payer: Self-pay | Admitting: *Deleted

## 2021-07-29 ENCOUNTER — Emergency Department (HOSPITAL_COMMUNITY)
Admission: EM | Admit: 2021-07-29 | Discharge: 2021-07-29 | Disposition: A | Discharge status: Home / Self Care | Payer: BC Managed Care – PPO | Attending: Emergency Medicine | Admitting: Emergency Medicine

## 2021-07-29 DIAGNOSIS — R110 Nausea: Secondary | ICD-10-CM | POA: Insufficient documentation

## 2021-07-29 DIAGNOSIS — Z5321 Procedure and treatment not carried out due to patient leaving prior to being seen by health care provider: Secondary | ICD-10-CM | POA: Insufficient documentation

## 2021-07-29 HISTORY — DX: Cerebral infarction, unspecified: I63.9

## 2021-07-29 NOTE — ED Triage Notes (Signed)
Nauseated onset today

## 2021-07-29 NOTE — ED Notes (Signed)
Patient decided not to be seen and signed out AMA

## 2021-08-25 ENCOUNTER — Encounter
Payer: BC Managed Care – PPO | Attending: Physical Medicine and Rehabilitation | Admitting: Physical Medicine and Rehabilitation

## 2021-09-05 ENCOUNTER — Ambulatory Visit (HOSPITAL_COMMUNITY): Payer: BC Managed Care – PPO | Attending: Physical Medicine and Rehabilitation | Admitting: Occupational Therapy

## 2021-09-05 ENCOUNTER — Ambulatory Visit (HOSPITAL_COMMUNITY): Payer: BC Managed Care – PPO

## 2021-09-12 ENCOUNTER — Encounter: Payer: BC Managed Care – PPO | Attending: Physical Medicine and Rehabilitation | Admitting: Registered Nurse

## 2021-09-21 ENCOUNTER — Encounter: Payer: Self-pay | Admitting: Neurology

## 2021-09-21 ENCOUNTER — Ambulatory Visit: Payer: BC Managed Care – PPO | Admitting: Neurology

## 2021-09-29 ENCOUNTER — Other Ambulatory Visit: Payer: Self-pay

## 2021-09-29 ENCOUNTER — Encounter
Payer: BC Managed Care – PPO | Attending: Physical Medicine and Rehabilitation | Admitting: Physical Medicine and Rehabilitation

## 2021-09-29 ENCOUNTER — Encounter: Payer: Self-pay | Admitting: Physical Medicine and Rehabilitation

## 2021-09-29 VITALS — BP 133/90 | HR 107 | Temp 98.0°F | Ht 64.0 in | Wt 160.2 lb

## 2021-09-29 DIAGNOSIS — I69398 Other sequelae of cerebral infarction: Secondary | ICD-10-CM | POA: Diagnosis not present

## 2021-09-29 DIAGNOSIS — R252 Cramp and spasm: Secondary | ICD-10-CM | POA: Insufficient documentation

## 2021-09-29 DIAGNOSIS — I610 Nontraumatic intracerebral hemorrhage in hemisphere, subcortical: Secondary | ICD-10-CM | POA: Insufficient documentation

## 2021-09-29 DIAGNOSIS — G4701 Insomnia due to medical condition: Secondary | ICD-10-CM | POA: Insufficient documentation

## 2021-09-29 MED ORDER — BACLOFEN 10 MG PO TABS: 10.0000 mg | ORAL_TABLET | Freq: Every evening | ORAL | 11 refills | Status: AC

## 2021-09-29 NOTE — Progress Notes (Signed)
Subjective:    Patient ID: Christian Russell, male    DOB: May 09, 1971, 51 y.o.   MRN: [id number]  HPI Christian Russell is a 51 year old man who presents for hospital follow-up after CIR admission for CVA  1) Right side pain -hurts all the time  2) Right arm and leg -still with numbness but strength much improved. -has not been able to go to PT due to finances -walking on his own  -going up and down steps -walks with his mother.  -writing his name but still needs help with paperwork.   3) Right toe curling -hurts after walking  Pain Inventory Average Pain 5 Pain Right Now 4 My pain is  nerve type pain  LOCATION OF PAIN  right arm and leg  BOWEL Number of stools per week: 7  BLADDER Normal   Mobility walk without assistance ability to climb steps?  yes do you drive?  no  Function not employed: date last employed 06/05/21 I need assistance with the following:  meal prep, household duties, and shopping  Neuro/Psych weakness numbness tremor tingling trouble walking  Prior Studies none   Physicians involved in your care Any changes since last visit?  no Primary care saw PCP 07/06/21   Family History  Problem Relation Age of Onset   Arthritis Mother        Osteoarthritis   Arthritis Father    COPD Father    Diabetes Father    Hypertension Father    Heart disease Father    Social History   Socioeconomic History   Marital status: Single    Spouse name: Not on file   Number of children: Not on file   Years of education: Not on file   Highest education level: Not on file  Occupational History   Not on file  Tobacco Use   Smoking status: Never   Smokeless tobacco: Never  Substance and Sexual Activity   Alcohol use: Not Currently    Comment: occasional   Drug use: No   Sexual activity: Yes  Other Topics Concern   Not on file  Social History Narrative   Not on file   Social Determinants of Health   Financial Resource Strain: Not on file  Food  Insecurity: Not on file  Transportation Needs: Not on file  Physical Activity: Not on file  Stress: Not on file  Social Connections: Not on file   Past Surgical History:  Procedure Laterality Date   NO PAST SURGERIES     Past Medical History:  Diagnosis Date   Medical history non-contributory    Stroke (HCC)    BP 133/90    Pulse (!) 107    Temp 98 F (36.7 C)    Ht 5\' 4"  (1.626 m)    Wt 160 lb 3.2 oz (72.7 kg)    SpO2 95%    BMI 27.50 kg/m   Opioid Risk Score:   Fall Risk Score:  `1  Depression screen PHQ 2/9  Depression screen [facility] 2/9 09/29/2021 05/26/2016  Decreased Interest 0 2  Down, Depressed, Hopeless 0 0  PHQ - 2 Score 0 2  Altered sleeping 1 1  Tired, decreased energy 0 1  Change in appetite 0 0  Feeling bad or failure about yourself  0 0  Trouble concentrating 1 1  Moving slowly or fidgety/restless 0 0  Suicidal thoughts 0 0  PHQ-9 Score 2 5     Review of Systems  Constitutional: Negative.  HENT: Negative.    Eyes: Negative.   Respiratory: Negative.    Cardiovascular: Negative.   Gastrointestinal: Negative.   Endocrine: Negative.   Genitourinary: Negative.   Musculoskeletal:  Positive for gait problem.  Skin: Negative.   Allergic/Immunologic: Negative.   Neurological:  Positive for weakness and numbness.       Tingling  Hematological: Negative.   Psychiatric/Behavioral: Negative.    All other systems reviewed and are negative.     Objective:   Physical Exam  Gen: no distress, normal appearing HEENT: oral mucosa pink and moist, NCAT Cardio: Reg rate Chest: normal effort, normal rate of breathing Abd: soft, non-distended Ext: no edema Psych: pleasant, normal affect Skin: intact Neuro: Right sided strength much improved! Still with foot drop and steppade gait. 4/5 hand gripp     Assessment & Plan:  1) CVA -start therapies -would benefit from handicap placard to increase 53 mobility in the community -provided dietary and exercise  counseling -discussed that hypertension is number one reversible risk factor for stroke.  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   2) Toe curling -discussed baclofen  3) Insomnia -discussed baclofen could help with this too.   4) Low back pain: continue to follow-up with the pain clinic

## 2021-10-19 IMAGING — CT CT NECK W/ CM
4 series · 14 of 33 positions shown, 17 images · IV contrast (Omnipaque or Isovue)
Comparison: Brain MRI 06/12/2016.

CLINICAL DATA: 48-year-old male with facial swelling on the right
around the mandible for 2 days. Possible dental abscess.

EXAM:
CT NECK WITH CONTRAST
TECHNIQUE: Multidetector CT imaging of the neck was performed using the
standard protocol following the bolus administration of intravenous
contrast.
CONTRAST:  75mL OMNIPAQUE IOHEXOL 300 MG/ML  SOLN

[Series 2: axial neck · axial · 0.56mm/px · z∈[+1326,+1466]mm · 5 of 106 slices shown, 7 images]
[im 18/106  soft-tissue]
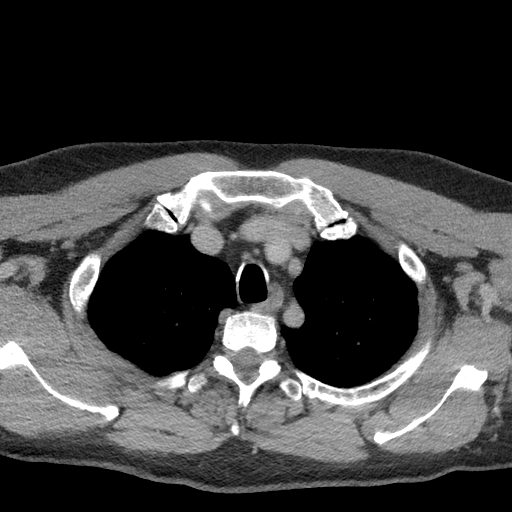
[im 18/106  bone]
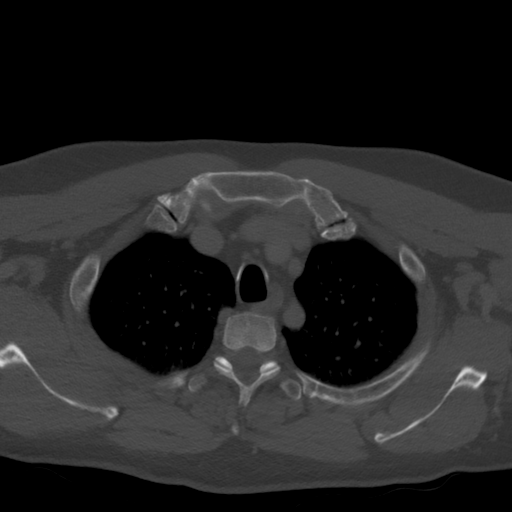
[im 36/106  bone]
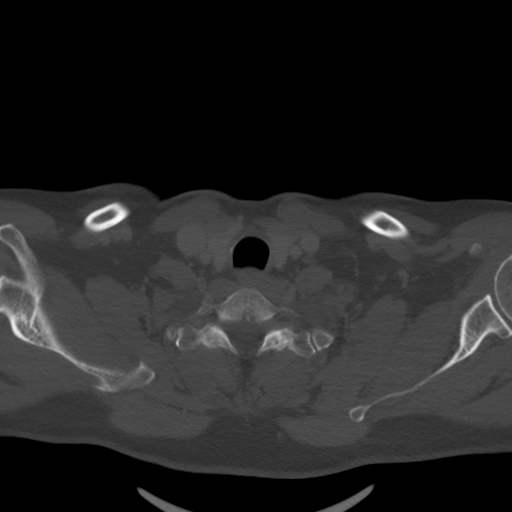
[im 53/106  bone]
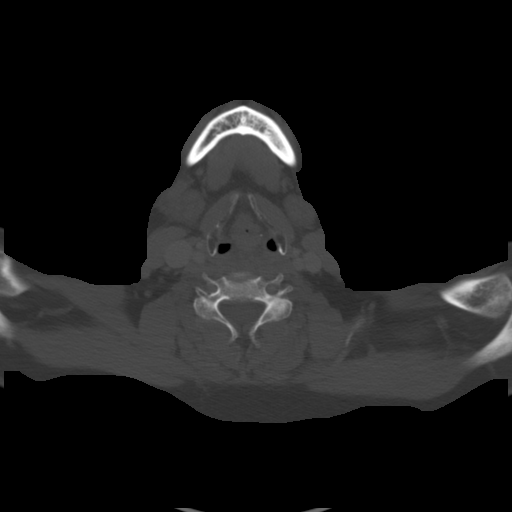
[im 71/106  bone]
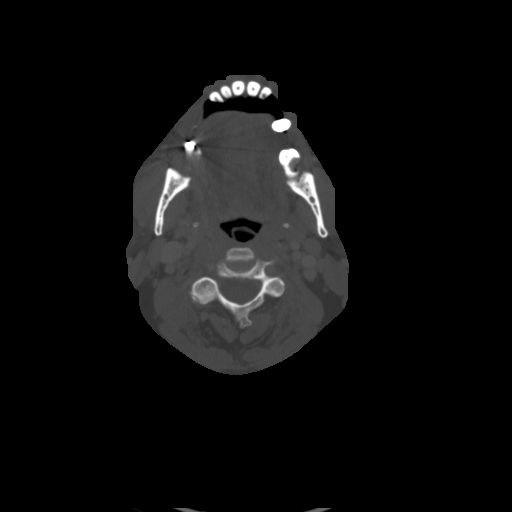
[im 88/106  soft-tissue]
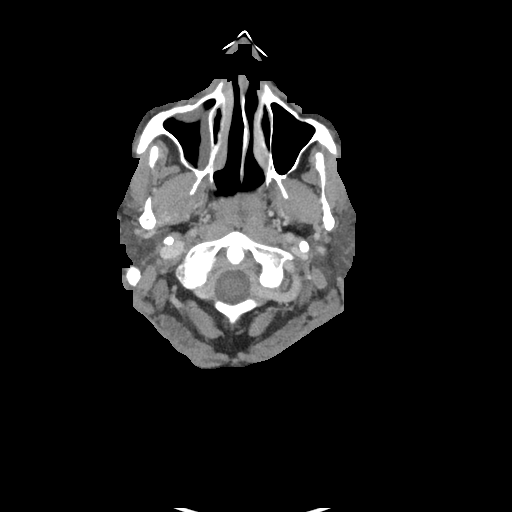
[im 88/106  bone]
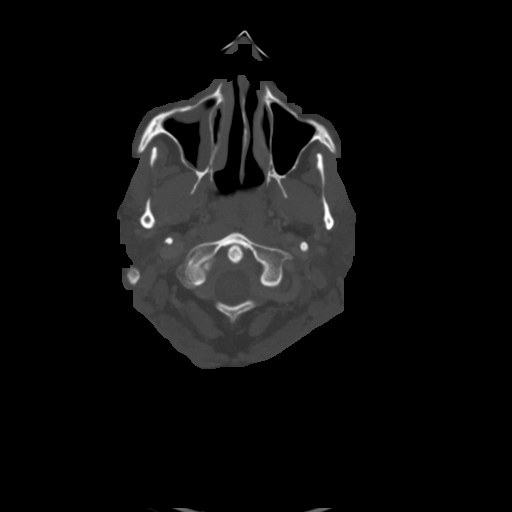

[Series 4: cor neck · coronal · 0.39mm/px · 3 of 121 slices shown]
[im 25/121  bone]
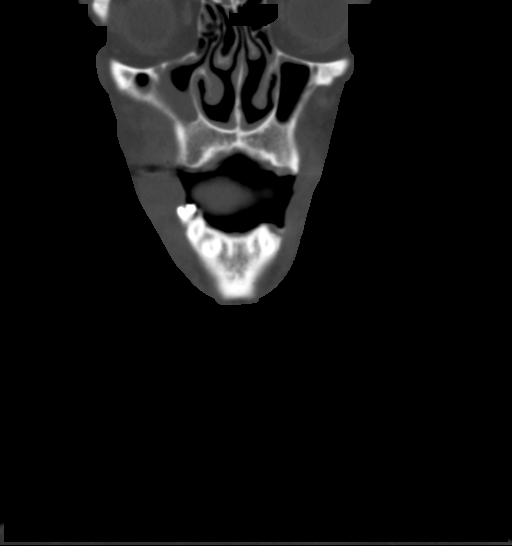
[im 49/121  bone]
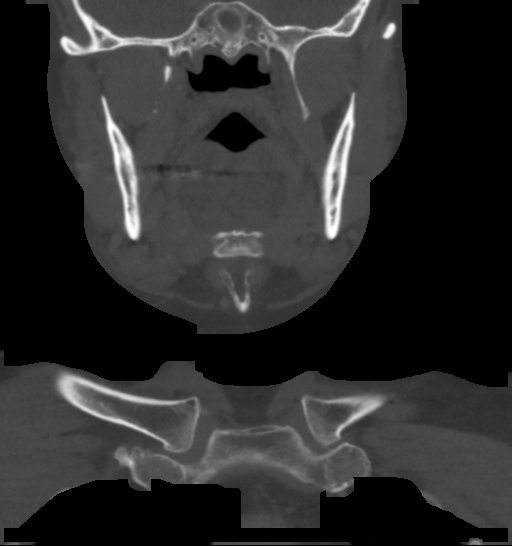
[im 73/121  bone]
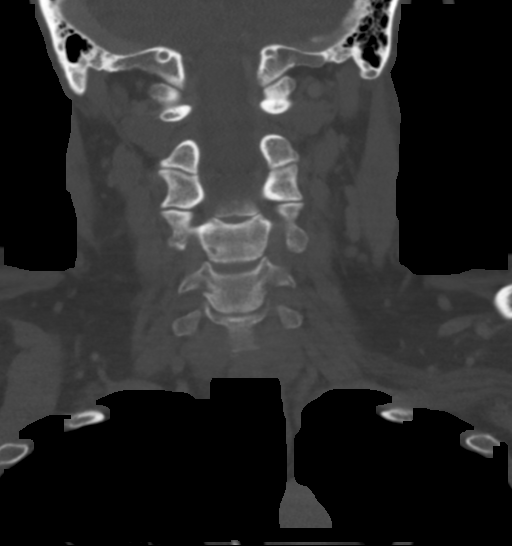

[Series 5: sag neck · sagittal · 0.43mm/px · 5 of 101 slices shown, 6 images]
[im 34/101  bone]
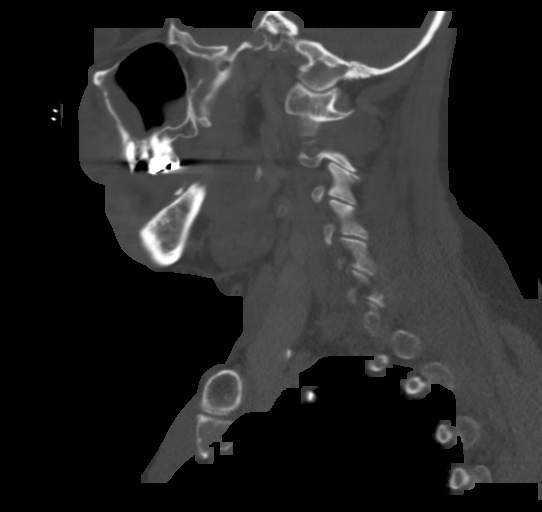
[im 42/101  bone]
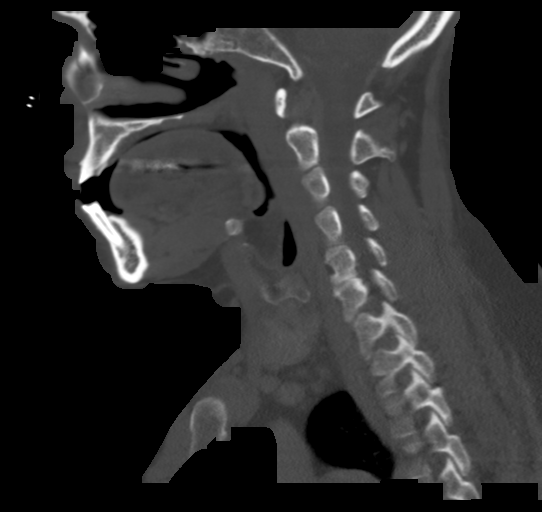
[im 51/101  soft-tissue]
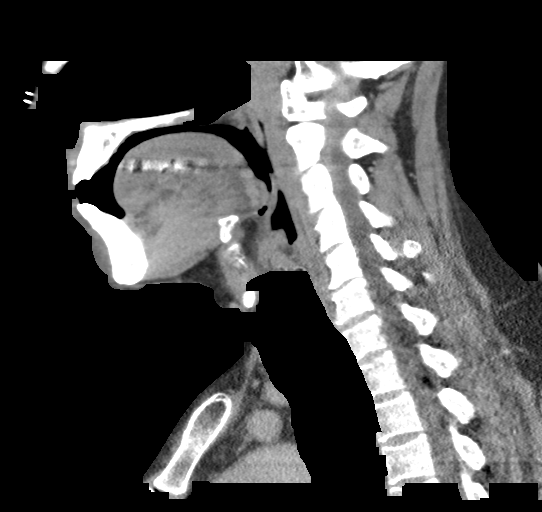
[im 51/101  bone]
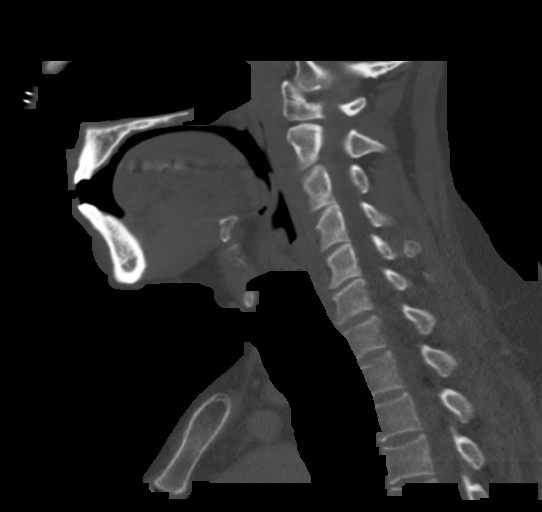
[im 59/101  bone]
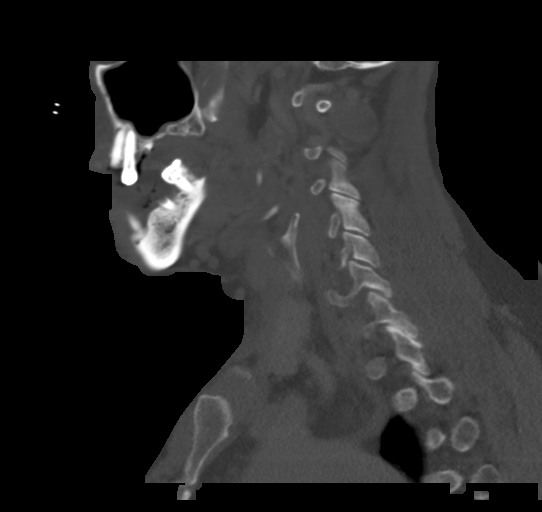
[im 67/101  bone]
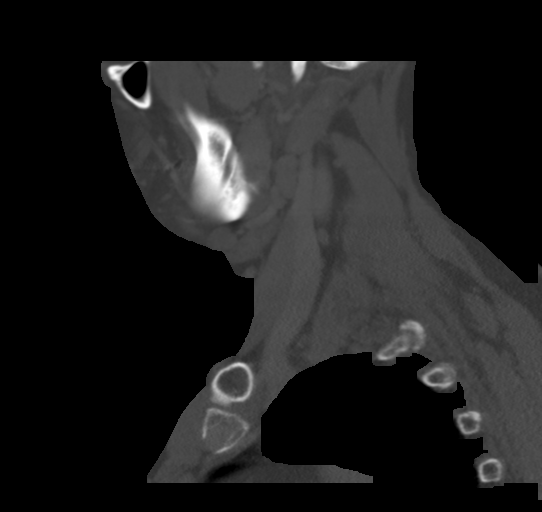

[Series 6: ax oropharynx · axial · 0.39mm/px · 1 of 107 slices shown]
[im 18/107  bone]
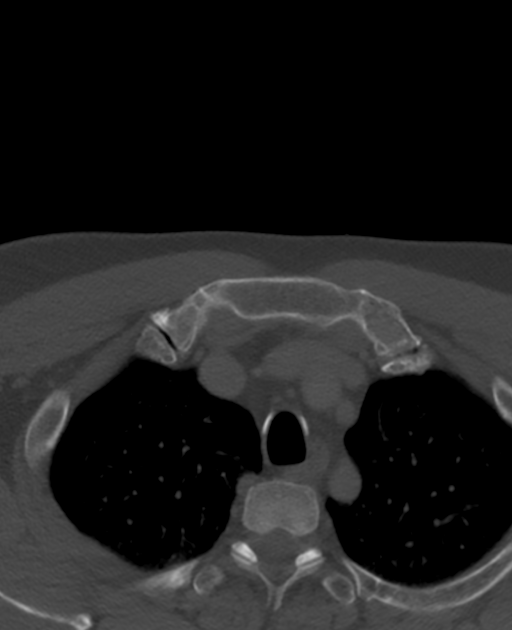

[14 of 33 positions shown; findings below may reference images not displayed]

FINDINGS: Pharynx and larynx: The glottis is closed. Laryngeal and pharyngeal
soft tissue contours are within normal limits. Negative
parapharyngeal and retropharyngeal spaces.

Salivary glands: Negative sublingual space.

Fairly symmetric appearing increased bilateral submandibular space
lymph nodes (level 1B) up to 11 mm short axis. Asymmetric soft
tissue stranding and thickening of the platysma on the right around
the mandible and superficial to the right masticator space. But the
submandibular glands appear symmetric and do not appear inflamed. No
soft tissue gas or fluid collection is identified. See also dental
details below.

Parotid glands are within normal limits.

Thyroid: Negative.

Lymph nodes: Reactive appearing bilateral level 1 B lymph nodes
described above. Smaller 7-8 mm bilateral level 2 lymph nodes.
Smaller lymph nodes elsewhere. No cystic or necrotic nodes.

Vascular: Major vascular structures in the neck and at the skull
base appear patent including the right IJ. Dominant appearing left
vertebral artery. No atherosclerosis identified.

Limited intracranial: Negative.

Visualized orbits: Negative.

Mastoids and visualized paranasal sinuses: Moderate mucosal
thickening throughout the right maxillary alveolar recess, see right
maxillary molar findings below. Other paranasal sinuses are well
pneumatized. There is a trace fluid level in the right sphenoid
sinus. Visible tympanic cavities and mastoids are clear.

Skeleton: Extensive bilateral carious dentition. Carious anterior
right maxillary molar with periapical lucency projecting into the
right maxillary alveolar recess (sagittal image 37). Left mandible
periapical lucency with cortical breakthrough at the posterior
bicuspid (coronal image 31). But no inflammation in that region.

No other acute osseous abnormality. No significant degenerative
changes in the visible spine.

Upper chest: Negative.
IMPRESSION: 1. Right Face Cellulitis, which might be odontogenic (see #2)
although no obvious single dental source. And no abscess or
complicating features.

2. Extensive bilateral carious dentition. Probable odontogenic right
maxillary sinus disease.

3. Reactive appearing bilateral level 1 and level 2 lymph nodes.

## 2022-06-21 ENCOUNTER — Ambulatory Visit (HOSPITAL_COMMUNITY): Payer: Medicaid Other | Admitting: Occupational Therapy

## 2022-06-23 ENCOUNTER — Ambulatory Visit (HOSPITAL_COMMUNITY): Payer: Medicaid Other | Attending: Family Medicine | Admitting: Occupational Therapy

## 2022-06-27 ENCOUNTER — Ambulatory Visit (HOSPITAL_COMMUNITY): Payer: Medicaid Other | Admitting: Physical Therapy

## 2022-10-02 ENCOUNTER — Encounter
Payer: Medicaid Other | Attending: Physical Medicine and Rehabilitation | Admitting: Physical Medicine and Rehabilitation

## 2023-02-05 ENCOUNTER — Ambulatory Visit: Payer: Medicaid Other | Admitting: Orthopedic Surgery

## 2023-02-05 ENCOUNTER — Encounter: Payer: Self-pay | Admitting: Orthopedic Surgery

## 2023-02-05 ENCOUNTER — Other Ambulatory Visit (INDEPENDENT_AMBULATORY_CARE_PROVIDER_SITE_OTHER): Payer: Medicaid Other

## 2023-02-05 VITALS — BP 116/78 | HR 93 | Ht 66.0 in | Wt 152.0 lb

## 2023-02-05 DIAGNOSIS — G8929 Other chronic pain: Secondary | ICD-10-CM

## 2023-02-05 DIAGNOSIS — M25512 Pain in left shoulder: Secondary | ICD-10-CM

## 2023-02-05 MED ORDER — METHYLPREDNISOLONE ACETATE 40 MG/ML IJ SUSP
40.0000 mg | Freq: Once | INTRAMUSCULAR | Status: AC
  Administered 2023-02-05: 40 mg via INTRA_ARTICULAR

## 2023-02-05 NOTE — Patient Instructions (Signed)

## 2023-02-05 NOTE — Progress Notes (Signed)
Chief Complaint  Patient presents with   Shoulder Pain    Left shoulder pain 2 weeks now    52 year old male who had a stroke which affected his right side presents with left shoulder pain after he tried to open the door.  He reports no prior shoulder pain he did feel a pop when he opened the door he feels a grinding sensation in his subacromial space now and pain with certain maneuvers.  He is having difficulty picking up a drink and drinking with his left arm and it hurts mostly when his arm is directly to the side of his body and he tries to lift it over his head  Review of systems no chest pain or shortness of breath  He appears to see a chronic pain manager and take opioids for chronic pain  Problem list, medical hx, medications and allergies reviewed   Allergies  Allergen Reactions   Hydrocodone Hives    Physical Exam Vitals and nursing note reviewed.  Constitutional:      Appearance: Normal appearance.  HENT:     Head: Normocephalic and atraumatic.  Eyes:     General: No scleral icterus.       Right eye: No discharge.        Left eye: No discharge.     Extraocular Movements: Extraocular movements intact.     Conjunctiva/sclera: Conjunctivae normal.     Pupils: Pupils are equal, round, and reactive to light.  Cardiovascular:     Rate and Rhythm: Normal rate.     Pulses: Normal pulses.  Skin:    General: Skin is warm and dry.     Capillary Refill: Capillary refill takes less than 2 seconds.  Neurological:     General: No focal deficit present.     Mental Status: He is alert and oriented to person, place, and time.  Psychiatric:        Mood and Affect: Mood normal.        Behavior: Behavior normal.        Thought Content: Thought content normal.        Judgment: Judgment normal.    Left shoulder abduction strength 5 empty can position 4 pain with each maneuver against resistance passive range of motion normal with pain at terminal flexion in the scapular plane  internal/external rotation normal crepitance and grinding noted in the subacromial space bicipital groove nontender  X-ray shows slight abnormality of Shenton's line of the shoulder may have a chronic cuff tear which progressed with the pulling of the door  Recommend subacromial injection recheck in 2 weeks if no improvement MRI of the shoulder  Injection procedure  Procedure note the subacromial injection shoulder left   Verbal consent was obtained to inject the  Left   Shoulder  Timeout was completed to confirm the injection site is a subacromial space of the  left  shoulder  Medication used Depo-Medrol 40 mg and lidocaine 1% 3 cc  Anesthesia was provided by ethyl chloride  The injection was performed in the left  posterior subacromial space. After pinning the skin with alcohol and anesthetized the skin with ethyl chloride the subacromial space was injected using a 20-gauge needle. There were no complications  Sterile dressing was applied.

## 2023-02-05 NOTE — Addendum Note (Signed)
Addended by: Michaele Offer on: 02/05/2023 04:48 PM   Modules accepted: Orders

## 2023-02-12 ENCOUNTER — Other Ambulatory Visit (HOSPITAL_COMMUNITY): Payer: Self-pay

## 2023-02-12 MED ORDER — OXYCODONE HCL 10 MG PO TABS
10.0000 mg | ORAL_TABLET | Freq: Every day | ORAL | 0 refills | Status: DC
  Filled 2023-02-12: qty 150, 30d supply, fill #0

## 2023-02-19 ENCOUNTER — Ambulatory Visit: Payer: Medicaid Other | Admitting: Orthopedic Surgery

## 2023-02-19 ENCOUNTER — Encounter: Payer: Self-pay | Admitting: Orthopedic Surgery

## 2023-02-19 VITALS — Ht 66.0 in | Wt 152.0 lb

## 2023-02-19 DIAGNOSIS — S46012D Strain of muscle(s) and tendon(s) of the rotator cuff of left shoulder, subsequent encounter: Secondary | ICD-10-CM

## 2023-02-19 NOTE — Patient Instructions (Signed)
While we are working on your approval for MRI please go ahead and call to schedule your appointment with [facility] within at least one (1) week.   Central Scheduling [phone]  

## 2023-02-19 NOTE — Progress Notes (Unsigned)
Chief Complaint  Patient presents with   Shoulder Pain    Left     Christian Russell 52 years old we treated him for left shoulder pain after he told us that he felt a pop when he opened the door a few weeks back.  He has a grinding sensation in his shoulder  We gave him an injection it helped a little bit but he still has a nagging pain and the weakness when he places things in the refrigerator  At this point it is prudent to go ahead and get the MRI so that we can make a better treatment plan whether it surgical or not  Left shoulder Skin envelope is normal Abduction strength and empty can position strength 5 and 4 respectively passive range of motion is normal with pain at terminal flexion in the scapular plane there is crepitance and grinding in the subacromial space  X-ray showed a break in Shenton's line  MRI left shoulder

## 2023-03-15 ENCOUNTER — Ambulatory Visit (HOSPITAL_COMMUNITY): Admission: RE | Admit: 2023-03-15 | Payer: Medicaid Other | Source: Ambulatory Visit

## 2023-03-22 ENCOUNTER — Ambulatory Visit: Payer: Medicaid Other | Admitting: Orthopedic Surgery

## 2023-04-11 ENCOUNTER — Other Ambulatory Visit (HOSPITAL_COMMUNITY): Payer: Self-pay

## 2023-04-11 MED ORDER — OXYCODONE HCL 10 MG PO TABS
10.0000 mg | ORAL_TABLET | Freq: Every day | ORAL | 0 refills | Status: AC
  Filled 2023-04-11: qty 150, 30d supply, fill #0

## 2023-06-20 ENCOUNTER — Other Ambulatory Visit (HOSPITAL_COMMUNITY): Payer: Self-pay

## 2023-06-20 MED ORDER — OXYCODONE HCL 10 MG PO TABS
10.0000 mg | ORAL_TABLET | Freq: Four times a day (QID) | ORAL | 0 refills | Status: AC | PRN
  Filled 2023-06-20: qty 120, 30d supply, fill #0

## 2023-06-20 MED ORDER — GABAPENTIN 100 MG PO CAPS
100.0000 mg | ORAL_CAPSULE | Freq: Three times a day (TID) | ORAL | 0 refills | Status: AC | PRN
  Filled 2023-06-20: qty 90, 30d supply, fill #0

## 2023-07-03 ENCOUNTER — Other Ambulatory Visit (HOSPITAL_COMMUNITY): Payer: Self-pay

## 2023-11-30 ENCOUNTER — Other Ambulatory Visit (HOSPITAL_COMMUNITY): Payer: Self-pay

## 2023-11-30 MED ORDER — LISINOPRIL 20 MG PO TABS
20.0000 mg | ORAL_TABLET | Freq: Every day | ORAL | 0 refills | Status: AC
  Filled 2023-11-30: qty 90, 90d supply, fill #0

## 2023-11-30 MED ORDER — OXYCODONE HCL 10 MG PO TABS
10.0000 mg | ORAL_TABLET | Freq: Four times a day (QID) | ORAL | 0 refills | Status: AC | PRN
  Filled 2023-11-30: qty 120, 30d supply, fill #0

## 2024-02-15 ENCOUNTER — Other Ambulatory Visit: Payer: Self-pay | Admitting: Orthopedic Surgery

## 2024-02-15 DIAGNOSIS — S46012D Strain of muscle(s) and tendon(s) of the rotator cuff of left shoulder, subsequent encounter: Secondary | ICD-10-CM
